# Patient Record
Sex: Female | Born: 1990 | Race: Black or African American | Hispanic: No | Marital: Single | Smoking: Current some day smoker
Health system: Southern US, Community
[De-identification: ages and names within clinical notes are randomized; demographics above are authoritative.]

## PROBLEM LIST (undated history)

## (undated) ENCOUNTER — Inpatient Hospital Stay (HOSPITAL_COMMUNITY): Payer: Self-pay

## (undated) DIAGNOSIS — D649 Anemia, unspecified: Secondary | ICD-10-CM

---

## 2015-11-16 ENCOUNTER — Encounter (HOSPITAL_COMMUNITY): Payer: Self-pay | Admitting: *Deleted

## 2015-11-16 ENCOUNTER — Inpatient Hospital Stay (HOSPITAL_COMMUNITY)
Admission: AD | Admit: 2015-11-16 | Discharge: 2015-11-16 | Disposition: A | Discharge status: Home / Self Care | Payer: BLUE CROSS/BLUE SHIELD | Source: Ambulatory Visit | Attending: Obstetrics and Gynecology | Admitting: Obstetrics and Gynecology

## 2015-11-16 ENCOUNTER — Inpatient Hospital Stay (HOSPITAL_COMMUNITY): Payer: BLUE CROSS/BLUE SHIELD

## 2015-11-16 DIAGNOSIS — R102 Pelvic and perineal pain: Secondary | ICD-10-CM | POA: Diagnosis not present

## 2015-11-16 DIAGNOSIS — N83201 Unspecified ovarian cyst, right side: Secondary | ICD-10-CM | POA: Diagnosis not present

## 2015-11-16 DIAGNOSIS — O219 Vomiting of pregnancy, unspecified: Secondary | ICD-10-CM | POA: Diagnosis not present

## 2015-11-16 DIAGNOSIS — O3481 Maternal care for other abnormalities of pelvic organs, first trimester: Secondary | ICD-10-CM | POA: Diagnosis not present

## 2015-11-16 DIAGNOSIS — Z3491 Encounter for supervision of normal pregnancy, unspecified, first trimester: Secondary | ICD-10-CM

## 2015-11-16 DIAGNOSIS — O98811 Other maternal infectious and parasitic diseases complicating pregnancy, first trimester: Secondary | ICD-10-CM

## 2015-11-16 DIAGNOSIS — O23591 Infection of other part of genital tract in pregnancy, first trimester: Secondary | ICD-10-CM

## 2015-11-16 DIAGNOSIS — O26891 Other specified pregnancy related conditions, first trimester: Secondary | ICD-10-CM | POA: Diagnosis present

## 2015-11-16 DIAGNOSIS — B373 Candidiasis of vulva and vagina: Secondary | ICD-10-CM

## 2015-11-16 DIAGNOSIS — K117 Disturbances of salivary secretion: Secondary | ICD-10-CM

## 2015-11-16 DIAGNOSIS — O26899 Other specified pregnancy related conditions, unspecified trimester: Secondary | ICD-10-CM

## 2015-11-16 DIAGNOSIS — Z3A01 Less than 8 weeks gestation of pregnancy: Secondary | ICD-10-CM | POA: Insufficient documentation

## 2015-11-16 DIAGNOSIS — O209 Hemorrhage in early pregnancy, unspecified: Secondary | ICD-10-CM | POA: Diagnosis not present

## 2015-11-16 DIAGNOSIS — N760 Acute vaginitis: Secondary | ICD-10-CM

## 2015-11-16 DIAGNOSIS — B3731 Acute candidiasis of vulva and vagina: Secondary | ICD-10-CM

## 2015-11-16 DIAGNOSIS — B9689 Other specified bacterial agents as the cause of diseases classified elsewhere: Secondary | ICD-10-CM

## 2015-11-16 DIAGNOSIS — R109 Unspecified abdominal pain: Secondary | ICD-10-CM

## 2015-11-16 HISTORY — DX: Anemia, unspecified: D64.9

## 2015-11-16 LAB — COMPREHENSIVE METABOLIC PANEL
ALT: 15 U/L (ref 14–54)
ANION GAP: 9 (ref 5–15)
AST: 18 U/L (ref 15–41)
Albumin: 4.2 g/dL (ref 3.5–5.0)
Alkaline Phosphatase: 63 U/L (ref 38–126)
BUN: 6 mg/dL (ref 6–20)
CHLORIDE: 104 mmol/L (ref 101–111)
CO2: 21 mmol/L — AB (ref 22–32)
Calcium: 9.1 mg/dL (ref 8.9–10.3)
Creatinine, Ser: 0.55 mg/dL (ref 0.44–1.00)
GFR calc non Af Amer: >60 mL/min (ref >60)
Glucose, Bld: 93 mg/dL (ref 65–99)
POTASSIUM: 3.4 mmol/L — AB (ref 3.5–5.1)
SODIUM: 134 mmol/L — AB (ref 135–145)
Total Bilirubin: 0.6 mg/dL (ref 0.3–1.2)
Total Protein: 7.9 g/dL (ref 6.5–8.1)

## 2015-11-16 LAB — URINALYSIS, ROUTINE W REFLEX MICROSCOPIC
Bilirubin Urine: NEGATIVE
GLUCOSE, UA: NEGATIVE mg/dL
HGB URINE DIPSTICK: NEGATIVE
KETONES UR: 15 mg/dL — AB
LEUKOCYTES UA: NEGATIVE
Nitrite: NEGATIVE
PH: 6.5 (ref 5.0–8.0)
Protein, ur: NEGATIVE mg/dL
Specific Gravity, Urine: 1.02 (ref 1.005–1.030)

## 2015-11-16 LAB — WET PREP, GENITAL
SPERM: NONE SEEN
TRICH WET PREP: NONE SEEN

## 2015-11-16 LAB — ABO/RH: ABO/RH(D): O POS

## 2015-11-16 LAB — CBC
HCT: 37.7 % (ref 36.0–46.0)
HEMOGLOBIN: 12.4 g/dL (ref 12.0–15.0)
MCH: 25 pg — AB (ref 26.0–34.0)
MCHC: 32.9 g/dL (ref 30.0–36.0)
MCV: 76 fL — AB (ref 78.0–100.0)
Platelets: 311 10*3/uL (ref 150–400)
RBC: 4.96 MIL/uL (ref 3.87–5.11)
RDW: 19.5 % — ABNORMAL HIGH (ref 11.5–15.5)
WBC: 8.8 10*3/uL (ref 4.0–10.5)

## 2015-11-16 LAB — POCT PREGNANCY, URINE: Preg Test, Ur: POSITIVE — AB

## 2015-11-16 LAB — HCG, QUANTITATIVE, PREGNANCY: hCG, Beta Chain, Quant, S: 48488 m[IU]/mL — ABNORMAL HIGH (ref <5)

## 2015-11-16 MED ORDER — METRONIDAZOLE 500 MG PO TABS: 500.0000 mg | ORAL_TABLET | Freq: Two times a day (BID) | ORAL | 0 refills | Status: DC

## 2015-11-16 MED ORDER — METOCLOPRAMIDE HCL 10 MG PO TABS
10.0000 mg | ORAL_TABLET | Freq: Once | ORAL | Status: AC
  Administered 2015-11-16: 10 mg via ORAL
  Filled 2015-11-16: qty 1

## 2015-11-16 MED ORDER — GLYCOPYRROLATE 1 MG PO TABS
2.0000 mg | ORAL_TABLET | Freq: Once | ORAL | Status: AC
  Administered 2015-11-16: 2 mg via ORAL
  Filled 2015-11-16: qty 2

## 2015-11-16 MED ORDER — LACTATED RINGERS IV BOLUS (SEPSIS): 1000.0000 mL | Freq: Once | INTRAVENOUS | Status: DC

## 2015-11-16 MED ORDER — GLYCOPYRROLATE 0.2 MG/ML IJ SOLN
0.2000 mg | Freq: Once | INTRAMUSCULAR | Status: DC
  Filled 2015-11-16: qty 1

## 2015-11-16 MED ORDER — PROMETHAZINE HCL 25 MG PO TABS: 25.0000 mg | ORAL_TABLET | Freq: Four times a day (QID) | ORAL | 0 refills | Status: DC | PRN

## 2015-11-16 MED ORDER — GLYCOPYRROLATE 1 MG PO TABS: 1.0000 mg | ORAL_TABLET | Freq: Three times a day (TID) | ORAL | 0 refills | Status: DC

## 2015-11-16 MED ORDER — TERCONAZOLE 0.8 % VA CREA: 1.0000 | TOPICAL_CREAM | Freq: Every day | VAGINAL | 0 refills | Status: DC

## 2015-11-16 MED ORDER — METOCLOPRAMIDE HCL 5 MG/ML IJ SOLN: 10.0000 mg | Freq: Once | INTRAMUSCULAR | Status: DC

## 2015-11-16 NOTE — Discharge Instructions (Signed)
Abdominal Pain During Pregnancy °Abdominal pain is common in pregnancy. Most of the time, it does not cause harm. There are many causes of abdominal pain. Some causes are more serious than others. Some of the causes of abdominal pain in pregnancy are easily diagnosed. Occasionally, the diagnosis takes time to understand. Other times, the cause is not determined. Abdominal pain can be a sign that something is very wrong with the pregnancy, or the pain may have nothing to do with the pregnancy at all. For this reason, always tell your health care provider if you have any abdominal discomfort. °HOME CARE INSTRUCTIONS  °Monitor your abdominal pain for any changes. The following actions may help to alleviate any discomfort you are experiencing: °· Do not have sexual intercourse or put anything in your vagina until your symptoms go away completely. °· Get plenty of rest until your pain improves. °· Drink clear fluids if you feel nauseous. Avoid solid food as long as you are uncomfortable or nauseous. °· Only take over-the-counter or prescription medicine as directed by your health care provider. °· Keep all follow-up appointments with your health care provider. °SEEK IMMEDIATE MEDICAL CARE IF: °· You are bleeding, leaking fluid, or passing tissue from the vagina. °· You have increasing pain or cramping. °· You have persistent vomiting. °· You have painful or bloody urination. °· You have a fever. °· You notice a decrease in your baby's movements. °· You have extreme weakness or feel faint. °· You have shortness of breath, with or without abdominal pain. °· You develop a severe headache with abdominal pain. °· You have abnormal vaginal discharge with abdominal pain. °· You have persistent diarrhea. °· You have abdominal pain that continues even after rest, or gets worse. °MAKE SURE YOU:  °· Understand these instructions. °· Will watch your condition. °· Will get help right away if you are not doing well or get worse. °    °This information is not intended to replace advice given to you by your health care provider. Make sure you discuss any questions you have with your health care provider. °  °Document Released: 01/09/2005 Document Revised: 10/30/2012 Document Reviewed: 08/08/2012 °Elsevier Interactive Patient Education ©2016 Elsevier Inc[employer] °Morning Sickness °Morning sickness is when you feel sick to your stomach (nauseous) during pregnancy. This nauseous feeling may or may not come with vomiting. It often occurs in the morning but can be a problem any time of day. Morning sickness is most common during the first trimester, but it may continue throughout pregnancy. While morning sickness is unpleasant, it is usually harmless unless you develop severe and continual vomiting (hyperemesis gravidarum). This condition requires more intense treatment.  °CAUSES  °The cause of morning sickness is not completely known but seems to be related to normal hormonal changes that occur in pregnancy. °RISK FACTORS °You are at greater risk if you: °· Experienced nausea or vomiting before your pregnancy. °· Had morning sickness during a previous pregnancy. °· Are pregnant with more than one baby, such as twins. °TREATMENT  °Do not use any medicines (prescription, over-the-counter, or herbal) for morning sickness without first talking to your health care provider. Your health care provider may prescribe or recommend: °· Vitamin B6 supplements. °· Anti-nausea medicines. °· The herbal medicine ginger. °HOME CARE INSTRUCTIONS  °· Only take over-the-counter or prescription medicines as directed by your health care provider. °· Taking multivitamins before getting pregnant can prevent or decrease the severity of morning sickness in most women. °· Eat a piece   of dry toast or unsalted crackers before getting out of bed in the morning. °· Eat five or six small meals a day. °· Eat dry and bland foods (rice, baked potato). Foods high in carbohydrates are often  helpful. °· Do not drink liquids with your meals. Drink liquids between meals. °· Avoid greasy, fatty, and spicy foods. °· Get someone to cook for you if the smell of any food causes nausea and vomiting. °· If you feel nauseous after taking prenatal vitamins, take the vitamins at night or with a snack.  °· Snack on protein foods (nuts, yogurt, cheese) between meals if you are hungry. °· Eat unsweetened gelatins for desserts. °· Wearing an acupressure wristband (worn for sea sickness) may be helpful. °· Acupuncture may be helpful. °· Do not smoke. °· Get a humidifier to keep the air in your house free of odors. °· Get plenty of fresh air. °SEEK MEDICAL CARE IF:  °· Your home remedies are not working, and you need medicine. °· You feel dizzy or lightheaded. °· You are losing weight. °SEEK IMMEDIATE MEDICAL CARE IF:  °· You have persistent and uncontrolled nausea and vomiting. °· You pass out (faint). °MAKE SURE YOU: °· Understand these instructions. °· Will watch your condition. °· Will get help right away if you are not doing well or get worse. °  °This information is not intended to replace advice given to you by your health care provider. Make sure you discuss any questions you have with your health care provider. °  °Document Released: 03/02/2006 Document Revised: 01/14/2013 Document Reviewed: 06/26/2012 °Elsevier Interactive Patient Education ©2016 Elsevier Inc. ° °

## 2015-11-16 NOTE — MAU Note (Signed)
Pt had pos HPT last week, vomiting for the last 3 weeks, denies diarrhea.  Also having lower abd pain also for 3 weeks, had spotting yesterday, none today.

## 2015-11-16 NOTE — MAU Provider Note (Signed)
History     CSN: [id number]  Arrival date and time: 11/16/15 1309   First Provider Initiated Contact with Patient 11/16/15 1513      Chief Complaint  Patient presents with  . Abdominal Pain  . Vaginal Bleeding  . Emesis During Pregnancy   HPI  Elizabeth Strong is a 25 y.o. G2P0010 at 68w5dby LMP who presents with n/v & abdominal cramping. Reports n/v since finding out she was pregnant. States she hasn't been able to keep down food or fluids for that last 5 days. Has not tried to eat anything today; attempted to drink ginger ale this morning but vomited. Reports vomiting 3 times today. Denies heartburn. Pt primarily spitting during interview.  Lower abdominal cramping for the last few weeks that has gradually worsened. Pain is intermittent. Rates pain 9/10. Has not treated. Nothing makes better or worse. Had 1 episode of pink spotting last night. Denies recent intercourse.  Denies diarrhea, constipation, fever, vaginal discharge, or dysuria.   OB History    Gravida Para Term Preterm AB Living   2       1     SAB TAB Ectopic Multiple Live Births   1              Past Medical History:  Diagnosis Date  . Anemia     History reviewed. No pertinent surgical history.  History reviewed. No pertinent family history.  Social History  Substance Use Topics  . Smoking status: Unknown If Ever Smoked  . Smokeless tobacco: Never Used  . Alcohol use Yes     Comment: drinks socially    Allergies: No Known Allergies  Prescriptions Prior to Admission  Medication Sig Dispense Refill Last Dose  . ibuprofen (ADVIL,MOTRIN) 200 MG tablet Take 400 mg by mouth every 6 (six) hours as needed for headache, mild pain or moderate pain.   11/15/2015 at 2200    Review of Systems  Constitutional: Negative.   Gastrointestinal: Positive for abdominal pain, nausea and vomiting. Negative for constipation, diarrhea and heartburn.  Genitourinary: Negative for dysuria.       + episode of pink spotting  last night   Physical Exam   Blood pressure 104/55, pulse 64, temperature 98.7 F (37.1 C), temperature source Oral, resp. rate 18, height '5\' 3"'$  (1.6 m), weight 124 lb (56.2 kg), last menstrual period 10/07/2015.  Physical Exam  Nursing note and vitals reviewed. Constitutional: She is oriented to person, place, and time. She appears well-developed and well-nourished. No distress.  HENT:  Head: Normocephalic and atraumatic.  Eyes: Conjunctivae are normal. Right eye exhibits no discharge. Left eye exhibits no discharge. No scleral icterus.  Neck: Normal range of motion.  Cardiovascular: Normal rate, regular rhythm and normal heart sounds.   No murmur heard. Respiratory: Effort normal and breath sounds normal. No respiratory distress. She has no wheezes.  GI: Soft. Bowel sounds are normal. She exhibits no distension. There is no tenderness. There is no rebound and no guarding.  Genitourinary: Uterus normal. Cervix exhibits no motion tenderness and no friability. Right adnexum displays no mass and no tenderness. Left adnexum displays no mass and no tenderness. No bleeding in the vagina. Vaginal discharge (small amount of creamy white discharge) found.  Genitourinary Comments: Cervix closed  Neurological: She is alert and oriented to person, place, and time.  Skin: Skin is warm and dry. She is not diaphoretic.  Psychiatric: She has a normal mood and affect. Her behavior is normal. Judgment and thought content  normal.    MAU Course  Procedures Results for orders placed or performed during the hospital encounter of 11/16/15 (from the past 48 hour(s))  Urinalysis, Routine w reflex microscopic (not at [facility])     Status: Abnormal   Collection Time: 11/16/15  1:35 PM  Result Value Ref Range   Color, Urine YELLOW YELLOW   APPearance CLOUDY (A) CLEAR   Specific Gravity, Urine 1.020 1.005 - 1.030   pH 6.5 5.0 - 8.0   Glucose, UA NEGATIVE NEGATIVE mg/dL   Hgb urine dipstick NEGATIVE NEGATIVE    Bilirubin Urine NEGATIVE NEGATIVE   Ketones, ur 15 (A) NEGATIVE mg/dL   Protein, ur NEGATIVE NEGATIVE mg/dL   Nitrite NEGATIVE NEGATIVE   Leukocytes, UA NEGATIVE NEGATIVE    Comment: MICROSCOPIC NOT DONE ON URINES WITH NEGATIVE PROTEIN, BLOOD, LEUKOCYTES, NITRITE, OR GLUCOSE <1000 mg/dL.  Pregnancy, urine POC     Status: Abnormal   Collection Time: 11/16/15  1:40 PM  Result Value Ref Range   Preg Test, Ur POSITIVE (A) NEGATIVE    Comment:        THE SENSITIVITY OF THIS METHODOLOGY IS >24 mIU/mL   CBC     Status: Abnormal   Collection Time: 11/16/15  3:22 PM  Result Value Ref Range   WBC 8.8 4.0 - 10.5 K/uL   RBC 4.96 3.87 - 5.11 MIL/uL   Hemoglobin 12.4 12.0 - 15.0 g/dL   HCT 37.7 36.0 - 46.0 %   MCV 76.0 (L) 78.0 - 100.0 fL   MCH 25.0 (L) 26.0 - 34.0 pg   MCHC 32.9 30.0 - 36.0 g/dL   RDW 19.5 (H) 11.5 - 15.5 %   Platelets 311 150 - 400 K/uL  ABO/Rh     Status: None   Collection Time: 11/16/15  3:22 PM  Result Value Ref Range   ABO/RH(D) O POS   HIV antibody     Status: None   Collection Time: 11/16/15  3:22 PM  Result Value Ref Range   HIV Screen 4th Generation wRfx Non Reactive Non Reactive    Comment: (NOTE) Performed At: [facility] [address] [place], [place] [id number] Lindon Romp MD QR:[id number]   hCG, quantitative, pregnancy     Status: Abnormal   Collection Time: 11/16/15  3:24 PM  Result Value Ref Range   hCG, Beta Chain, Quant, S 48,488 (H) <5 mIU/mL    Comment:          GEST. AGE      CONC.  (mIU/mL)   <=1 WEEK        5 - 50     2 WEEKS       50 - 500     3 WEEKS       100 - 10,000     4 WEEKS     1,000 - 30,000     5 WEEKS     3,500 - 115,000   6-8 WEEKS     12,000 - 270,000    12 WEEKS     15,000 - 220,000        FEMALE AND NON-PREGNANT FEMALE:     LESS THAN 5 mIU/mL   Comprehensive metabolic panel     Status: Abnormal   Collection Time: 11/16/15  3:24 PM  Result Value Ref Range   Sodium 134 (L) 135 - 145 mmol/L   Potassium  3.4 (L) 3.5 - 5.1 mmol/L   Chloride 104 101 - 111 mmol/L   CO2 21 (  L) 22 - 32 mmol/L   Glucose, Bld 93 65 - 99 mg/dL   BUN 6 6 - 20 mg/dL   Creatinine, Ser 0.55 0.44 - 1.00 mg/dL   Calcium 9.1 8.9 - 10.3 mg/dL   Total Protein 7.9 6.5 - 8.1 g/dL   Albumin 4.2 3.5 - 5.0 g/dL   AST 18 15 - 41 U/L   ALT 15 14 - 54 U/L   Alkaline Phosphatase 63 38 - 126 U/L   Total Bilirubin 0.6 0.3 - 1.2 mg/dL   GFR calc non Af Amer >60 >60 mL/min   GFR calc Af Amer >60 >60 mL/min    Comment: (NOTE) The eGFR has been calculated using the CKD EPI equation. This calculation has not been validated in all clinical situations. eGFR's persistently <60 mL/min signify possible Chronic Kidney Disease.    Anion gap 9 5 - 15  Wet prep, genital     Status: Abnormal   Collection Time: 11/16/15  3:30 PM  Result Value Ref Range   Yeast Wet Prep HPF POC PRESENT (A) NONE SEEN   Trich, Wet Prep NONE SEEN NONE SEEN   Clue Cells Wet Prep HPF POC PRESENT (A) NONE SEEN   WBC, Wet Prep HPF POC MANY (A) NONE SEEN   Sperm NONE SEEN    US Ob Comp Less 14 Wks  Result Date: 11/16/2015 CLINICAL DATA:  25 year old pregnant female presenting with 3 weeks of intermittent mid pelvic pain. Spotting 1 day prior. Quantitative beta HCG [id number]. EDC by LMP: 07/13/2016, projecting to an expected gestational age of [redacted] weeks 5 days. EXAM: OBSTETRIC <14 WK [place] AND TRANSVAGINAL OB US TECHNIQUE: Both transabdominal and transvaginal ultrasound examinations were performed for complete evaluation of the gestation as well as the maternal uterus, adnexal regions, and pelvic cul-de-sac. Transvaginal technique was performed to assess early pregnancy. COMPARISON:  None. FINDINGS: Intrauterine gestational sac: Single intrauterine gestational sac appears normal in size, shape and position. Yolk sac:  Present. Embryo:  Present. Embryonic Cardiac Activity: Present. Embryonic Heart Rate: 110  bpm CRL:  1.9  mm   6 w   1 d                  [place] EDC: 07/10/2016  Subchorionic hemorrhage:  None visualized. Maternal uterus/adnexae: Uterus is anteverted. There is a solitary 1.6 x 1.3 x 2.0 cm subserosal fibroid demonstrated in the left anterior uterine body. Right ovary measures 5.3 x 3.7 x 4.5 cm and contains a simple 3.2 x 2.1 x 3.2 cm cyst, likely representing a corpus luteal cyst. Left ovary measures 2.6 x 1.8 x 1.7 cm. No suspicious ovarian or adnexal masses. IMPRESSION: 1. Single living intrauterine gestation at 6 weeks 1 day by crown-rump length, with no significant discrepancy with the expected gestational age of [redacted] weeks 5 days by provided menstrual dating. 2. No perigestational bleed. 3. Embryonic heart rate 110 beats per minute, borderline low, probably normal given the early gestational age. Consider a follow-up obstetric scan in 4 weeks to document appropriate fetal growth. 4. Simple 3.2 cm right ovarian cyst, likely a corpus luteal cyst. No suspicious ovarian or adnexal masses. 5. Solitary subserosal 2.0 cm left anterior uterine body fibroid. Electronically Signed   By: Ilona Sorrel M.D.   On: 11/16/2015 18:25   US Ob Transvaginal  Result Date: 11/16/2015 CLINICAL DATA:  25 year old pregnant female presenting with 3 weeks of intermittent mid pelvic pain. Spotting 1 day prior. Quantitative beta HCG [id number]. EDC by LMP: 07/13/2016,  projecting to an expected gestational age of [redacted] weeks 5 days. EXAM: OBSTETRIC <14 WK [place] AND TRANSVAGINAL OB US TECHNIQUE: Both transabdominal and transvaginal ultrasound examinations were performed for complete evaluation of the gestation as well as the maternal uterus, adnexal regions, and pelvic cul-de-sac. Transvaginal technique was performed to assess early pregnancy. COMPARISON:  None. FINDINGS: Intrauterine gestational sac: Single intrauterine gestational sac appears normal in size, shape and position. Yolk sac:  Present. Embryo:  Present. Embryonic Cardiac Activity: Present. Embryonic Heart Rate: 110  bpm CRL:  1.9  mm   6 w   1  d                  [place] EDC: 07/10/2016 Subchorionic hemorrhage:  None visualized. Maternal uterus/adnexae: Uterus is anteverted. There is a solitary 1.6 x 1.3 x 2.0 cm subserosal fibroid demonstrated in the left anterior uterine body. Right ovary measures 5.3 x 3.7 x 4.5 cm and contains a simple 3.2 x 2.1 x 3.2 cm cyst, likely representing a corpus luteal cyst. Left ovary measures 2.6 x 1.8 x 1.7 cm. No suspicious ovarian or adnexal masses. IMPRESSION: 1. Single living intrauterine gestation at 6 weeks 1 day by crown-rump length, with no significant discrepancy with the expected gestational age of [redacted] weeks 5 days by provided menstrual dating. 2. No perigestational bleed. 3. Embryonic heart rate 110 beats per minute, borderline low, probably normal given the early gestational age. Consider a follow-up obstetric scan in 4 weeks to document appropriate fetal growth. 4. Simple 3.2 cm right ovarian cyst, likely a corpus luteal cyst. No suspicious ovarian or adnexal masses. 5. Solitary subserosal 2.0 cm left anterior uterine body fibroid. Electronically Signed   By: Ilona Sorrel M.D.   On: 11/16/2015 18:25     MDM +UPT UA, wet prep, GC/chlamydia, CBC, ABO/Rh, quant hCG, HIV, and [place] today to rule out ectopic pregnancy  IV LR bolus, reglan 10 mg IV, & robinul 0.2 mg IV ---- pt declined IV; requests PO PO reglan 10 mg & robinul 1 mg Ultrasound shows SIUP with cardiac activity Pt reports improvement in symptoms  Assessment and Plan  A:  1. Normal IUP (intrauterine pregnancy) on prenatal ultrasound, first trimester   2. Vaginal bleeding in pregnancy, first trimester   3. Abdominal pain during pregnancy   4. Nausea and vomiting during pregnancy prior to [redacted] weeks gestation   5. Ptyalism   6. Vaginal yeast infection   7. BV (bacterial vaginosis)    P: Discharge home Rx robinul, phenergan, flagyl, & terazol Discussed reasons to return to [facility] Start prenatal care with provider of choice  Jorje Guild 11/16/2015, 3:13 PM

## 2015-11-17 LAB — HIV ANTIBODY (ROUTINE TESTING W REFLEX): HIV SCREEN 4TH GENERATION: NONREACTIVE

## 2015-11-17 LAB — GC/CHLAMYDIA PROBE AMP (~~LOC~~) NOT AT ARMC
Chlamydia: NEGATIVE
NEISSERIA GONORRHEA: NEGATIVE

## 2016-02-09 ENCOUNTER — Ambulatory Visit: Payer: BLUE CROSS/BLUE SHIELD | Admitting: Internal Medicine

## 2016-02-19 NOTE — Progress Notes (Addendum)
Office Visit Note  Patient: Elizabeth Strong             Date of Birth: 1990-01-24           MRN: [id number]             PCP: Kennyth Arnold, FNP Referring: Kennyth Arnold, FNP Visit Date: 02/22/2016 Occupation: [employer]    Subjective:  Rash   History of Present Illness: Elizabeth Strong is a 26 y.o. female who [facility] her rash and positive ANA. According to patient she has had eczema since she was a young child area and she recalls the rash she is to be pleuritic and used to bleed. She recalls in 2015 she started developing rash during the summertime which will be only 1 or 2 spots in different parts of her body which will come and go. September 2017 she moved into a mold infested apartment. She states one day she woke up with rash all over her body which was not pruritic but she had dark spots. She states that rash has been persistent since then and she has new lesions coming up most the time. She's been [facility] where she had blood work and also was given topical steroids which are not effective. She denies any joint pain.  Activities of Daily Living:  Patient reports morning stiffness for 0 minute.   Patient Denies nocturnal pain.  Difficulty dressing/grooming: Denies Difficulty climbing stairs: Denies Difficulty getting out of chair: Denies Difficulty using hands for taps, buttons, cutlery, and/or writing: Denies   Review of Systems  Constitutional: Negative for fatigue, night sweats, weight gain, weight loss and weakness.  HENT: Negative for mouth sores, trouble swallowing, trouble swallowing, mouth dryness and nose dryness.   Eyes: Negative for pain, redness, visual disturbance and dryness.  Respiratory: Negative for cough, shortness of breath and difficulty breathing.   Cardiovascular: Negative for chest pain, palpitations, hypertension, irregular heartbeat and swelling in legs/feet.  Gastrointestinal: Negative  for blood in stool, constipation and diarrhea.  Endocrine: Negative for increased urination.  Genitourinary: Negative for vaginal dryness.  Musculoskeletal: Negative for arthralgias, joint pain, joint swelling, myalgias, muscle weakness, morning stiffness, muscle tenderness and myalgias.  Skin: Positive for rash. Negative for color change, hair loss, skin tightness, ulcers and sensitivity to sunlight.  Allergic/Immunologic: Negative for susceptible to infections.  Neurological: Negative for dizziness, memory loss and night sweats.  Hematological: Negative for swollen glands.  Psychiatric/Behavioral: Positive for depressed mood and sleep disturbance. The patient is nervous/anxious.     PMFS History:  There are no active problems to display for this patient.   Past Medical History:  Diagnosis Date  . Anemia     Family History  Problem Relation Age of Onset  . Eczema Father    History reviewed. No pertinent surgical history. Social History   Social History Narrative  . No narrative on file     Objective: Vital Signs: BP 107/71 (BP Location: Left Arm, Patient Position: Sitting, Cuff Size: Normal)   Pulse 79   Resp 12   Ht 5' 3"  (1.6 m)   Wt 123 lb (55.8 kg)   LMP 10/07/2015   Breastfeeding? Unknown   BMI 21.79 kg/m    Physical Exam  Constitutional: She is oriented to person, place, and time. She appears well-developed and well-nourished.  HENT:  Head: Normocephalic and atraumatic.  Eyes: Conjunctivae and EOM are normal.  Neck: Normal range of motion.  Cardiovascular: Normal rate, regular rhythm, normal heart sounds and intact distal pulses.   Pulmonary/Chest: Effort normal and breath sounds normal.  Abdominal: Soft. Bowel sounds are normal.  Lymphadenopathy:    She has no cervical adenopathy.  Neurological: She is alert and oriented to person, place, and time.  Skin: Skin is warm and dry. Capillary refill takes less than 2 seconds.  She has multiple hyperpigmented  lesions on her trunk. Mostly localized on her buttocks and inguinal region. Few lesions on her back and chest. And few lesions on her feet. The lesions on her feet may be new.  Psychiatric: She has a normal mood and affect. Her behavior is normal.  Nursing note and vitals reviewed.    Musculoskeletal Exam: C-spine, thoracic, lumbar spine good range of motion she has good range of motion of her shoulders elbows wrists joint MCPs PIPs DIPs PIPs with no synovitis hip joints knee joints ankles MTPs PIPs DIPs with good range of motion with no synovitis.  CDAI Exam: No CDAI exam completed.    Investigation: Findings:  12/14/2015 ANA 1:40 speckled, SSA, SSB, SCL 70, Jo 1, RNP, centromere, anti-DNA, Smith, chromatin all negative, RPR negative    Imaging: No results found.  Speciality Comments: No specialty comments available.    Procedures:  No procedures performed Allergies: Patient has no known allergies.   Assessment / Plan:     Visit Diagnoses: Positive ANA (antinuclear antibody) - 1:40 speckled, ENA negative -her ANA titer is insignificant and ENA is negative. She has no sleep or rash with some hyperpigmentation but she has few new lesions on her feet. She has persistent rash going on since September now. I will refer her to dermatology for possible skin biopsy. At this point I'm uncertain to determine the etiology. Plan: Ambulatory referral to Dermatology. At this point I would avoid any steroids until diagnosis is established.  Rash - Plan: Ambulatory referral to Dermatology, CBC with Differential/Platelet, COMPLETE METABOLIC PANEL WITH GFR, Urinalysis, Routine w reflex microscopic, Sedimentation rate, CK, HLA-B27 antigen, Glucose 6 phosphate dehydrogenase, Pan-ANCA  History of asthma - Childhood    Orders: Orders Placed This Encounter  Procedures  . CBC with Differential/Platelet  . COMPLETE METABOLIC PANEL WITH GFR  . Urinalysis, Routine w reflex microscopic  .  Sedimentation rate  . CK  . HLA-B27 antigen  . Glucose 6 phosphate dehydrogenase  . Pan-ANCA  . Ambulatory referral to Dermatology   No orders of the defined types were placed in this encounter.   Face-to-face time spent with patient was 50 minutes. 50% of time was spent in counseling and coordination of care.  Follow-Up Instructions: Return for Rash. In about 8 weeks as it may take time to get the skin biopsy results.   Bo Merino, MD  Note - This record has been created using Editor, commissioning.  Chart creation errors have been sought, but may not always  have been located. Such creation errors do not reflect on  the standard of medical care.

## 2016-02-22 ENCOUNTER — Telehealth: Payer: Self-pay | Admitting: Radiology

## 2016-02-22 ENCOUNTER — Encounter: Payer: Self-pay | Admitting: Rheumatology

## 2016-02-22 ENCOUNTER — Ambulatory Visit (INDEPENDENT_AMBULATORY_CARE_PROVIDER_SITE_OTHER): Payer: BLUE CROSS/BLUE SHIELD | Admitting: Rheumatology

## 2016-02-22 VITALS — BP 107/71 | HR 79 | Resp 12 | Ht 63.0 in | Wt 123.0 lb

## 2016-02-22 DIAGNOSIS — R768 Other specified abnormal immunological findings in serum: Secondary | ICD-10-CM | POA: Diagnosis not present

## 2016-02-22 DIAGNOSIS — Z8709 Personal history of other diseases of the respiratory system: Secondary | ICD-10-CM | POA: Diagnosis not present

## 2016-02-22 DIAGNOSIS — R21 Rash and other nonspecific skin eruption: Secondary | ICD-10-CM | POA: Diagnosis not present

## 2016-02-22 LAB — CBC WITH DIFFERENTIAL/PLATELET
BASOS PCT: 1 %
Basophils Absolute: 62 cells/uL (ref 0–200)
Eosinophils Absolute: 62 cells/uL (ref 15–500)
Eosinophils Relative: 1 %
HEMATOCRIT: 43.7 % (ref 35.0–45.0)
HEMOGLOBIN: 13.6 g/dL (ref 11.7–15.5)
LYMPHS ABS: 1736 {cells}/uL (ref 850–3900)
Lymphocytes Relative: 28 %
MCH: 24.5 pg — ABNORMAL LOW (ref 27.0–33.0)
MCHC: 31.1 g/dL — ABNORMAL LOW (ref 32.0–36.0)
MCV: 78.7 fL — ABNORMAL LOW (ref 80.0–100.0)
MONO ABS: 620 {cells}/uL (ref 200–950)
MPV: 9.7 fL (ref 7.5–12.5)
Monocytes Relative: 10 %
NEUTROS ABS: 3720 {cells}/uL (ref 1500–7800)
Neutrophils Relative %: 60 %
Platelets: 317 10*3/uL (ref 140–400)
RBC: 5.55 MIL/uL — ABNORMAL HIGH (ref 3.80–5.10)
RDW: 17.4 % — ABNORMAL HIGH (ref 11.0–15.0)
WBC: 6.2 10*3/uL (ref 3.8–10.8)

## 2016-02-22 LAB — COMPLETE METABOLIC PANEL WITH GFR
ALBUMIN: 4.4 g/dL (ref 3.6–5.1)
ALK PHOS: 86 U/L (ref 33–115)
ALT: 20 U/L (ref 6–29)
AST: 24 U/L (ref 10–30)
BUN: 11 mg/dL (ref 7–25)
CALCIUM: 9.9 mg/dL (ref 8.6–10.2)
CO2: 21 mmol/L (ref 20–31)
CREATININE: 0.82 mg/dL (ref 0.50–1.10)
Chloride: 104 mmol/L (ref 98–110)
GFR, Est African American: >89 mL/min (ref >=60)
GFR, Est Non African American: >89 mL/min (ref >=60)
GLUCOSE: 84 mg/dL (ref 65–99)
Potassium: 4.6 mmol/L (ref 3.5–5.3)
SODIUM: 140 mmol/L (ref 135–146)
TOTAL PROTEIN: 8.3 g/dL — AB (ref 6.1–8.1)
Total Bilirubin: 0.5 mg/dL (ref 0.2–1.2)

## 2016-02-22 NOTE — Telephone Encounter (Signed)
I have called Dermatology and have made patient an appointment for tomorrow with Dr Doreen BeamWhitworth. [facility] You can complete the referral, see me if questions.

## 2016-02-22 NOTE — Patient Instructions (Signed)
  You can go tomorrow for the dermatology appointment with Dr Doreen BeamWhitworth. The appointment will be on 02/23/15 at 10:20am. Dr Corliss Skainseveshwar has asked for them to see you and do a biopsy of your rash, please take this paperwork with you when you go   Address: [address], [place], [place] [address]  Phone: [phone]

## 2016-02-23 LAB — URINALYSIS, ROUTINE W REFLEX MICROSCOPIC
Bilirubin Urine: NEGATIVE
GLUCOSE, UA: NEGATIVE
Hgb urine dipstick: NEGATIVE
Leukocytes, UA: NEGATIVE
Nitrite: NEGATIVE
PROTEIN: NEGATIVE
Specific Gravity, Urine: 1.026 (ref 1.001–1.035)
pH: 6 (ref 5.0–8.0)

## 2016-02-23 LAB — URINALYSIS, MICROSCOPIC ONLY
Casts: NONE SEEN [LPF]
Crystals: NONE SEEN [HPF]
YEAST: NONE SEEN [HPF]

## 2016-02-23 LAB — PAN-ANCA: ANCA Screen: NEGATIVE

## 2016-02-23 LAB — CK: Total CK: 89 U/L (ref 7–177)

## 2016-02-23 LAB — SEDIMENTATION RATE: SED RATE: 1 mm/h (ref 0–20)

## 2016-02-23 LAB — GLUCOSE 6 PHOSPHATE DEHYDROGENASE: G-6PDH: 15.1 U/g{Hb} (ref 7.0–20.5)

## 2016-02-29 LAB — HLA-B27 ANTIGEN: DNA Result:: NEGATIVE

## 2016-03-14 DIAGNOSIS — Z8709 Personal history of other diseases of the respiratory system: Secondary | ICD-10-CM | POA: Insufficient documentation

## 2016-03-14 DIAGNOSIS — R768 Other specified abnormal immunological findings in serum: Secondary | ICD-10-CM | POA: Insufficient documentation

## 2016-03-14 NOTE — Progress Notes (Deleted)
Office Visit Note  Patient: Elizabeth Strong             Date of Birth: 1990/03/22           MRN: [id number]             PCP: Kennyth Arnold, FNP Referring: No ref. provider found Visit Date: 03/24/2016 Occupation: _0 @    Subjective:  No chief complaint on file.   History of Present Illness: Elizabeth Strong is a 26 y.o. female ***   Activities of Daily Living:  Patient reports morning stiffness for *** {minute/hour:19697}.   Patient {ACTIONS;DENIES/REPORTS:21021675::"Denies"} nocturnal pain.  Difficulty dressing/grooming: {ACTIONS;DENIES/REPORTS:21021675::"Denies"} Difficulty climbing stairs: {ACTIONS;DENIES/REPORTS:21021675::"Denies"} Difficulty getting out of chair: {ACTIONS;DENIES/REPORTS:21021675::"Denies"} Difficulty using hands for taps, buttons, cutlery, and/or writing: {ACTIONS;DENIES/REPORTS:21021675::"Denies"}   No Rheumatology ROS completed.   PMFS History:  Patient Active Problem List   Diagnosis Date Noted  . ANA positive 03/14/2016  . History of asthma 03/14/2016    Past Medical History:  Diagnosis Date  . Anemia     Family History  Problem Relation Age of Onset  . Eczema Father    No past surgical history on file. Social History   Social History Narrative  . No narrative on file     Objective: Vital Signs: LMP 10/07/2015    Physical Exam   Musculoskeletal Exam: ***  CDAI Exam: No CDAI exam completed.    Investigation: Findings:  12/14/2015 ANA 1:40 speckled, SSA, SSB, SCL 70, Jo 1, RNP, centromere, anti-DNA, Smith, chromatin all negative, RPR negative 02/22/2016 CBC normal, CMP normal, ESR 1, CK 89, UA positive bacteria negative WBC, pANCA negative, HLA-B27 negative, G6PD normal  Office Visit on 02/22/2016  Component Date Value Ref Range Status  . WBC 02/22/2016 6.2  3.8 - 10.8 K/uL Final  . RBC 02/22/2016 5.55* 3.80 - 5.10 MIL/uL Final  . Hemoglobin 02/22/2016 13.6  11.7 - 15.5 g/dL Final  . HCT 02/22/2016 43.7  35.0 - 45.0  % Final  . MCV 02/22/2016 78.7* 80.0 - 100.0 fL Final  . MCH 02/22/2016 24.5* 27.0 - 33.0 pg Final  . MCHC 02/22/2016 31.1* 32.0 - 36.0 g/dL Final  . RDW 02/22/2016 17.4* 11.0 - 15.0 % Final  . Platelets 02/22/2016 317  140 - 400 K/uL Final  . MPV 02/22/2016 9.7  7.5 - 12.5 fL Final  . Neutro Abs 02/22/2016 3720  1,500 - 7,800 cells/uL Final  . Lymphs Abs 02/22/2016 1736  850 - 3,900 cells/uL Final  . Monocytes Absolute 02/22/2016 620  200 - 950 cells/uL Final  . Eosinophils Absolute 02/22/2016 62  15 - 500 cells/uL Final  . Basophils Absolute 02/22/2016 62  0 - 200 cells/uL Final  . Neutrophils Relative % 02/22/2016 60  % Final  . Lymphocytes Relative 02/22/2016 28  % Final  . Monocytes Relative 02/22/2016 10  % Final  . Eosinophils Relative 02/22/2016 1  % Final  . Basophils Relative 02/22/2016 1  % Final  . Smear Review 02/22/2016 Criteria for review not met   Final  . Sodium 02/22/2016 140  135 - 146 mmol/L Final  . Potassium 02/22/2016 4.6  3.5 - 5.3 mmol/L Final  . Chloride 02/22/2016 104  98 - 110 mmol/L Final  . CO2 02/22/2016 21  20 - 31 mmol/L Final  . Glucose, Bld 02/22/2016 84  65 - 99 mg/dL Final  . BUN 02/22/2016 11  7 - 25 mg/dL Final  . Creat 02/22/2016 0.82  0.50 - 1.10 mg/dL Final  .  Total Bilirubin 02/22/2016 0.5  0.2 - 1.2 mg/dL Final  . Alkaline Phosphatase 02/22/2016 86  33 - 115 U/L Final  . AST 02/22/2016 24  10 - 30 U/L Final  . ALT 02/22/2016 20  6 - 29 U/L Final  . Total Protein 02/22/2016 8.3* 6.1 - 8.1 g/dL Final  . Albumin 02/22/2016 4.4  3.6 - 5.1 g/dL Final  . Calcium 02/22/2016 9.9  8.6 - 10.2 mg/dL Final  . GFR, Est African American 02/22/2016 >89  >=60 mL/min Final  . GFR, Est Non African American 02/22/2016 >89  >=60 mL/min Final  . Color, Urine 02/22/2016 YELLOW  YELLOW Final  . APPearance 02/22/2016 TURBID* CLEAR Final  . Specific Gravity, Urine 02/22/2016 1.026  1.001 - 1.035 Final  . pH 02/22/2016 6.0  5.0 - 8.0 Final  . Glucose, UA  02/22/2016 NEGATIVE  NEGATIVE Final  . Bilirubin Urine 02/22/2016 NEGATIVE  NEGATIVE Final  . Ketones, ur 02/22/2016 TRACE* NEGATIVE Final  . Hgb urine dipstick 02/22/2016 NEGATIVE  NEGATIVE Final  . Protein, ur 02/22/2016 NEGATIVE  NEGATIVE Final  . Nitrite 02/22/2016 NEGATIVE  NEGATIVE Final  . Leukocytes, UA 02/22/2016 NEGATIVE  NEGATIVE Final  . Sed Rate 02/22/2016 1  0 - 20 mm/hr Final  . Total CK 02/22/2016 89  7 - 177 U/L Final  . DNA Result: 02/22/2016 Negative  Negative Final  . Results reviewed by: 02/22/2016 REPORT   Final   Comment: Ileene Hutchinson, Ph.D.,[facility] Senior Director, Molecular Genetics The B27 allele group of the HLA-B locus is present in 2 to 9% of the general population.  About 20% of HLA-B27 carriers develop autoimmune disorders including Ankylosing Spondylitis (AS), Reactive Arthritis, Psoriatic Arthritis, Undifferentiated Oligoarthritis, Uveitis, or Inflammatory Bowel Disease. The highest association is with AS, where approximately 95% of AS patients are HLA-B27 positive. Genetic counseling as needed. Typing performed by PCR and hybridization with sequence specific oligonucleotide probes (SSO) using the FDA-cleared LABType(R) SSO Kit.   Marland Kitchen G-6PDH 02/22/2016 15.1  7.0 - 20.5 U/g Hgb Final  . ANCA Screen 02/22/2016 Negative   Final   Comment:             ** Normal Reference Range: Negative **   ANCA Screen includes evaluation for p-ANCA, c-ANCA and Atypical p-ANCA.   Marland Kitchen Myeloperoxidase Abs 02/22/2016 <1.0  <1.0 AI Final   Comment:                                 Value   Interpretation                              <1.0 AI: No Antibody Detected                           >or=1.0 AI: Antibody Detected   Autoantibodies to myeloperoxidase (MPO) are commonly associated with the following small-vessel vasculitides: microscopic polyangiitis, polyarteritis nodosa, Churg-Strauss syndrome, necrotizing and crescentic glomerulonephritis and occasionally  Wegener's granulomatosis. The perinuclear IFA pattern, (p-ANCA), is based largely on autoantibody to myeloperoxidase which serves as the primary antigen   These autoantibodies are present in active disease state.     . Serine Protease 3 02/22/2016 <1.0  <1.0 AI Final   Comment:  Value   Interpretation                              <1.0 AI: No Antibody Detected                           >or=1.0 AI: Antibody Detected   Autoantibodies to proteinase-3 (PR-3) are accepted as characteristic for granulomatosis with polyangiitis (Wegener's), and are detectable in 95% of the histologically proven cases. The cytoplasmic IFA pattern, (c-ANCA), is based largely on autoantibody to PR-3 which serves as the primary antigen.   These autoantibodies are present in active disease state.     . WBC, UA 02/22/2016 0-5  <=5 WBC/HPF Final  . RBC / HPF 02/22/2016 0-2  <=2 RBC/HPF Final  . Squamous Epithelial / LPF 02/22/2016 0-5  <=5 HPF Final  . Bacteria, UA 02/22/2016 MANY* NONE SEEN HPF Final  . Crystals 02/22/2016 NONE SEEN  NONE SEEN HPF Final  . Casts 02/22/2016 NONE SEEN  NONE SEEN LPF Final  . Yeast 02/22/2016 NONE SEEN  NONE SEEN HPF Final  . Urine-Other 02/22/2016 SEE NOTE   Final     Imaging: No results found.  Speciality Comments: No specialty comments available.    Procedures:  No procedures performed Allergies: Patient has no known allergies.   Assessment / Plan:     Visit Diagnoses: ANA positive  Rash and other nonspecific skin eruption  History of asthma   Dermatologist evaluation and a skin biopsy? Orders: No orders of the defined types were placed in this encounter.  No orders of the defined types were placed in this encounter.   Face-to-face time spent with patient was *** minutes. 50% of time was spent in counseling and coordination of care.  Follow-Up Instructions: No Follow-up on file.   Amy Littrell, RT  Note - This  record has been created using [employer].  Chart creation errors have been sought, but may not always  have been located. Such creation errors do not reflect on  the standard of medical care.

## 2016-03-24 ENCOUNTER — Ambulatory Visit: Payer: Self-pay | Admitting: Rheumatology

## 2016-03-28 ENCOUNTER — Telehealth: Payer: Self-pay | Admitting: Rheumatology

## 2016-03-28 NOTE — Telephone Encounter (Signed)
Patient is scheduled for 04/18/16 @ 2:30.

## 2016-03-28 NOTE — Telephone Encounter (Signed)
-----   Message from Henriette CombsAndrea L Hatton, LPN sent at 1/6/10963/06/2016 11:40 AM EST ----- Please call patient and reschedule new patient follow up. Thanks!

## 2016-04-11 NOTE — Progress Notes (Deleted)
Office Visit Note  Patient: Elizabeth Strong             Date of Birth: [birth year]           MRN: [id number]             PCP: Kennyth Arnold, FNP Referring: Kennyth Arnold, FNP Visit Date: 04/18/2016 Occupation: @GUAROCC @    Subjective:  No chief complaint on file.   History of Present Illness: Elizabeth Strong is a 26 y.o. female ***   Activities of Daily Living:  Patient reports morning stiffness for *** {minute/hour:19697}.   Patient {ACTIONS;DENIES/REPORTS:21021675::"Denies"} nocturnal pain.  Difficulty dressing/grooming: {ACTIONS;DENIES/REPORTS:21021675::"Denies"} Difficulty climbing stairs: {ACTIONS;DENIES/REPORTS:21021675::"Denies"} Difficulty getting out of chair: {ACTIONS;DENIES/REPORTS:21021675::"Denies"} Difficulty using hands for taps, buttons, cutlery, and/or writing: {ACTIONS;DENIES/REPORTS:21021675::"Denies"}   No Rheumatology ROS completed.   PMFS History:  Patient Active Problem List   Diagnosis Date Noted  . ANA positive 03/14/2016  . History of asthma 03/14/2016    Past Medical History:  Diagnosis Date  . Anemia     Family History  Problem Relation Age of Onset  . Eczema Father    No past surgical history on file. Social History   Social History Narrative  . No narrative on file     Objective: Vital Signs: LMP 10/07/2015    Physical Exam   Musculoskeletal Exam: ***  CDAI Exam: No CDAI exam completed.    Investigation: Findings:  02/22/2016 CBC normal, CMP normal, UA negative, WBC negative, bacteria positive ESR 1 CK 89, G6PD normal, and ANCA negative, HLA-B27 negative  Office Visit on 02/22/2016  Component Date Value Ref Range Status  . WBC 02/22/2016 6.2  3.8 - 10.8 K/uL Final  . RBC 02/22/2016 5.55* 3.80 - 5.10 MIL/uL Final  . Hemoglobin 02/22/2016 13.6  11.7 - 15.5 g/dL Final  . HCT 02/22/2016 43.7  35.0 - 45.0 % Final  . MCV 02/22/2016 78.7* 80.0 - 100.0 fL Final  . MCH 02/22/2016 24.5* 27.0 - 33.0 pg Final  . MCHC  02/22/2016 31.1* 32.0 - 36.0 g/dL Final  . RDW 02/22/2016 17.4* 11.0 - 15.0 % Final  . Platelets 02/22/2016 317  140 - 400 K/uL Final  . MPV 02/22/2016 9.7  7.5 - 12.5 fL Final  . Neutro Abs 02/22/2016 3720  1,500 - 7,800 cells/uL Final  . Lymphs Abs 02/22/2016 1736  850 - 3,900 cells/uL Final  . Monocytes Absolute 02/22/2016 620  200 - 950 cells/uL Final  . Eosinophils Absolute 02/22/2016 62  15 - 500 cells/uL Final  . Basophils Absolute 02/22/2016 62  0 - 200 cells/uL Final  . Neutrophils Relative % 02/22/2016 60  % Final  . Lymphocytes Relative 02/22/2016 28  % Final  . Monocytes Relative 02/22/2016 10  % Final  . Eosinophils Relative 02/22/2016 1  % Final  . Basophils Relative 02/22/2016 1  % Final  . Smear Review 02/22/2016 Criteria for review not met   Final  . Sodium 02/22/2016 140  135 - 146 mmol/L Final  . Potassium 02/22/2016 4.6  3.5 - 5.3 mmol/L Final  . Chloride 02/22/2016 104  98 - 110 mmol/L Final  . CO2 02/22/2016 21  20 - 31 mmol/L Final  . Glucose, Bld 02/22/2016 84  65 - 99 mg/dL Final  . BUN 02/22/2016 11  7 - 25 mg/dL Final  . Creat 02/22/2016 0.82  0.50 - 1.10 mg/dL Final  . Total Bilirubin 02/22/2016 0.5  0.2 - 1.2 mg/dL Final  . Alkaline Phosphatase 02/22/2016 86  33 - 115 U/L Final  . AST 02/22/2016 24  10 - 30 U/L Final  . ALT 02/22/2016 20  6 - 29 U/L Final  . Total Protein 02/22/2016 8.3* 6.1 - 8.1 g/dL Final  . Albumin 02/22/2016 4.4  3.6 - 5.1 g/dL Final  . Calcium 02/22/2016 9.9  8.6 - 10.2 mg/dL Final  . GFR, Est African American 02/22/2016 >89  >=60 mL/min Final  . GFR, Est Non African American 02/22/2016 >89  >=60 mL/min Final  . Color, Urine 02/22/2016 YELLOW  YELLOW Final  . APPearance 02/22/2016 TURBID* CLEAR Final  . Specific Gravity, Urine 02/22/2016 1.026  1.001 - 1.035 Final  . pH 02/22/2016 6.0  5.0 - 8.0 Final  . Glucose, UA 02/22/2016 NEGATIVE  NEGATIVE Final  . Bilirubin Urine 02/22/2016 NEGATIVE  NEGATIVE Final  . Ketones, ur  02/22/2016 TRACE* NEGATIVE Final  . Hgb urine dipstick 02/22/2016 NEGATIVE  NEGATIVE Final  . Protein, ur 02/22/2016 NEGATIVE  NEGATIVE Final  . Nitrite 02/22/2016 NEGATIVE  NEGATIVE Final  . Leukocytes, UA 02/22/2016 NEGATIVE  NEGATIVE Final  . Sed Rate 02/22/2016 1  0 - 20 mm/hr Final  . Total CK 02/22/2016 89  7 - 177 U/L Final  . DNA Result: 02/22/2016 Negative  Negative Final  . Results reviewed by: 02/22/2016 REPORT   Final   Comment: Ileene Hutchinson, Ph.D.,[facility] Senior Director, Molecular Genetics The B27 allele group of the HLA-B locus is present in 2 to 9% of the general population.  About 20% of HLA-B27 carriers develop autoimmune disorders including Ankylosing Spondylitis (AS), Reactive Arthritis, Psoriatic Arthritis, Undifferentiated Oligoarthritis, Uveitis, or Inflammatory Bowel Disease. The highest association is with AS, where approximately 95% of AS patients are HLA-B27 positive. Genetic counseling as needed. Typing performed by PCR and hybridization with sequence specific oligonucleotide probes (SSO) using the FDA-cleared LABType(R) SSO Kit.   Marland Kitchen G-6PDH 02/22/2016 15.1  7.0 - 20.5 U/g Hgb Final  . ANCA Screen 02/22/2016 Negative   Final   Comment:             ** Normal Reference Range: Negative **   ANCA Screen includes evaluation for p-ANCA, c-ANCA and Atypical p-ANCA.   Marland Kitchen Myeloperoxidase Abs 02/22/2016 <1.0  <1.0 AI Final   Comment:                                 Value   Interpretation                              <1.0 AI: No Antibody Detected                           >or=1.0 AI: Antibody Detected   Autoantibodies to myeloperoxidase (MPO) are commonly associated with the following small-vessel vasculitides: microscopic polyangiitis, polyarteritis nodosa, Churg-Strauss syndrome, necrotizing and crescentic glomerulonephritis and occasionally Wegener's granulomatosis. The perinuclear IFA pattern, (p-ANCA), is based largely on autoantibody to  myeloperoxidase which serves as the primary antigen   These autoantibodies are present in active disease state.     . Serine Protease 3 02/22/2016 <1.0  <1.0 AI Final   Comment:                                 Value   Interpretation                              <  1.0 AI: No Antibody Detected                           >or=1.0 AI: Antibody Detected   Autoantibodies to proteinase-3 (PR-3) are accepted as characteristic for granulomatosis with polyangiitis (Wegener's), and are detectable in 95% of the histologically proven cases. The cytoplasmic IFA pattern, (c-ANCA), is based largely on autoantibody to PR-3 which serves as the primary antigen.   These autoantibodies are present in active disease state.     . WBC, UA 02/22/2016 0-5  <=5 WBC/HPF Final  . RBC / HPF 02/22/2016 0-2  <=2 RBC/HPF Final  . Squamous Epithelial / LPF 02/22/2016 0-5  <=5 HPF Final  . Bacteria, UA 02/22/2016 MANY* NONE SEEN HPF Final  . Crystals 02/22/2016 NONE SEEN  NONE SEEN HPF Final  . Casts 02/22/2016 NONE SEEN  NONE SEEN LPF Final  . Yeast 02/22/2016 NONE SEEN  NONE SEEN HPF Final  . Urine-Other 02/22/2016 SEE NOTE   Final     Imaging: No results found.  Speciality Comments: No specialty comments available.    Procedures:  No procedures performed Allergies: Patient has no known allergies.   Assessment / Plan:     Visit Diagnoses: ANA positive - 1:40 speckled, ENA negative  Rash - Hyperpigmented lesions on trunk, buttocks, back, chest and feet  History of asthma   Skin biopsy? Dermatology visit? Orders: No orders of the defined types were placed in this encounter.  No orders of the defined types were placed in this encounter.   Face-to-face time spent with patient was *** minutes. 50% of time was spent in counseling and coordination of care.  Follow-Up Instructions: No Follow-up on file.   Bo Merino, MD  Note - This record has been created using [employer].  Chart  creation errors have been sought, but may not always  have been located. Such creation errors do not reflect on  the standard of medical care.

## 2016-04-18 ENCOUNTER — Ambulatory Visit: Payer: Self-pay | Admitting: Rheumatology

## 2016-04-19 NOTE — Progress Notes (Signed)
Office Visit Note  Patient: Elizabeth Strong             Date of Birth: 1990/05/14           MRN: [id number]             PCP: Kennyth Arnold, FNP Referring: Kennyth Arnold, FNP Visit Date: 04/27/2016 Occupation: _0 @    Subjective:  Rash.   History of Present Illness: Elizabeth Strong is a 26 y.o. female who was seen recently for low titer positive ANA and skin rash. According to her she has had few lesions on her feet which appeared new to her. The rest of the lesions are gradually healing. Patient states that she has lower back pain for the last 1 year. In February she was having some numbness in her right lower extremity which is improved now. She has some residual numbness in her toes.  Activities of Daily Living:  Patient reports morning stiffness for 5 minutes.   Patient Denies nocturnal pain.  Difficulty dressing/grooming: Denies Difficulty climbing stairs: Denies Difficulty getting out of chair: Denies Difficulty using hands for taps, buttons, cutlery, and/or writing: Denies   Review of Systems  Constitutional: Negative for fatigue, night sweats, weight gain, weight loss and weakness.  HENT: Negative for mouth sores, trouble swallowing, trouble swallowing, mouth dryness and nose dryness.   Eyes: Negative for pain, redness, visual disturbance and dryness.  Respiratory: Negative for cough, shortness of breath and difficulty breathing.   Cardiovascular: Negative for chest pain, palpitations, hypertension, irregular heartbeat and swelling in legs/feet.  Gastrointestinal: Negative for blood in stool, constipation and diarrhea.  Endocrine: Negative for increased urination.  Genitourinary: Negative for vaginal dryness.  Musculoskeletal: Positive for arthralgias and joint pain. Negative for joint swelling, myalgias, muscle weakness, morning stiffness, muscle tenderness and myalgias.       Lower back pain  Skin: Positive for rash. Negative for color change, hair loss, skin  tightness, ulcers and sensitivity to sunlight.  Allergic/Immunologic: Negative for susceptible to infections.  Neurological: Negative for dizziness, memory loss and night sweats.  Hematological: Negative for swollen glands.  Psychiatric/Behavioral: Negative for depressed mood and sleep disturbance. The patient is not nervous/anxious.     PMFS History:  Patient Active Problem List   Diagnosis Date Noted  . ANA positive 03/14/2016  . History of asthma 03/14/2016    Past Medical History:  Diagnosis Date  . Anemia     Family History  Problem Relation Age of Onset  . Eczema Father    History reviewed. No pertinent surgical history. Social History   Social History Narrative  . No narrative on file     Objective: Vital Signs: BP 107/70   Pulse 83   Resp 12   Ht _1  (1.6 m)   Wt 125 lb (56.7 kg)   LMP 10/07/2015   BMI 22.14 kg/m    Physical Exam  Constitutional: She is oriented to person, place, and time. She appears well-developed and well-nourished.  HENT:  Head: Normocephalic and atraumatic.  Eyes: Conjunctivae and EOM are normal.  Neck: Normal range of motion.  Cardiovascular: Normal rate, regular rhythm, normal heart sounds and intact distal pulses.   Pulmonary/Chest: Effort normal and breath sounds normal.  Abdominal: Soft. Bowel sounds are normal.  Lymphadenopathy:    She has no cervical adenopathy.  Neurological: She is alert and oriented to person, place, and time.  Skin: Skin is warm and dry. Capillary refill takes less than 2 seconds.  She  has some hyperpigmented macular lesions on her buttock area and lower extremities and her feet. Peeling of the skin was noted. No new lesions were noted.  Psychiatric: She has a normal mood and affect. Her behavior is normal.  Nursing note and vitals reviewed.    Musculoskeletal Exam: C-spine and thoracic lumbar spine good range of motion shoulders elbow joints wrist joint MCPs PIPs DIPs with good range of motion. Hip  joints knee joints ankles MTPs PIPs DIPs with good range of motion with no synovitis.  CDAI Exam: No CDAI exam completed.    Investigation: Findings:  02/22/2016 CBC, CMP normal, UA negative, CK normal, ESR 1, pain ANCA negative, HLA-B27 negative, G6PD normal   Office Visit on 02/22/2016  Component Date Value Ref Range Status  . WBC 02/22/2016 6.2  3.8 - 10.8 K/uL Final  . RBC 02/22/2016 5.55* 3.80 - 5.10 MIL/uL Final  . Hemoglobin 02/22/2016 13.6  11.7 - 15.5 g/dL Final  . HCT 02/22/2016 43.7  35.0 - 45.0 % Final  . MCV 02/22/2016 78.7* 80.0 - 100.0 fL Final  . MCH 02/22/2016 24.5* 27.0 - 33.0 pg Final  . MCHC 02/22/2016 31.1* 32.0 - 36.0 g/dL Final  . RDW 02/22/2016 17.4* 11.0 - 15.0 % Final  . Platelets 02/22/2016 317  140 - 400 K/uL Final  . MPV 02/22/2016 9.7  7.5 - 12.5 fL Final  . Neutro Abs 02/22/2016 3720  1,500 - 7,800 cells/uL Final  . Lymphs Abs 02/22/2016 1736  850 - 3,900 cells/uL Final  . Monocytes Absolute 02/22/2016 620  200 - 950 cells/uL Final  . Eosinophils Absolute 02/22/2016 62  15 - 500 cells/uL Final  . Basophils Absolute 02/22/2016 62  0 - 200 cells/uL Final  . Neutrophils Relative % 02/22/2016 60  % Final  . Lymphocytes Relative 02/22/2016 28  % Final  . Monocytes Relative 02/22/2016 10  % Final  . Eosinophils Relative 02/22/2016 1  % Final  . Basophils Relative 02/22/2016 1  % Final  . Smear Review 02/22/2016 Criteria for review not met   Final  . Sodium 02/22/2016 140  135 - 146 mmol/L Final  . Potassium 02/22/2016 4.6  3.5 - 5.3 mmol/L Final  . Chloride 02/22/2016 104  98 - 110 mmol/L Final  . CO2 02/22/2016 21  20 - 31 mmol/L Final  . Glucose, Bld 02/22/2016 84  65 - 99 mg/dL Final  . BUN 02/22/2016 11  7 - 25 mg/dL Final  . Creat 02/22/2016 0.82  0.50 - 1.10 mg/dL Final  . Total Bilirubin 02/22/2016 0.5  0.2 - 1.2 mg/dL Final  . Alkaline Phosphatase 02/22/2016 86  33 - 115 U/L Final  . AST 02/22/2016 24  10 - 30 U/L Final  . ALT 02/22/2016 20   6 - 29 U/L Final  . Total Protein 02/22/2016 8.3* 6.1 - 8.1 g/dL Final  . Albumin 02/22/2016 4.4  3.6 - 5.1 g/dL Final  . Calcium 02/22/2016 9.9  8.6 - 10.2 mg/dL Final  . GFR, Est African American 02/22/2016 >89  >=60 mL/min Final  . GFR, Est Non African American 02/22/2016 >89  >=60 mL/min Final  . Color, Urine 02/22/2016 YELLOW  YELLOW Final  . APPearance 02/22/2016 TURBID* CLEAR Final  . Specific Gravity, Urine 02/22/2016 1.026  1.001 - 1.035 Final  . pH 02/22/2016 6.0  5.0 - 8.0 Final  . Glucose, UA 02/22/2016 NEGATIVE  NEGATIVE Final  . Bilirubin Urine 02/22/2016 NEGATIVE  NEGATIVE Final  . Ketones, ur 02/22/2016  TRACE* NEGATIVE Final  . Hgb urine dipstick 02/22/2016 NEGATIVE  NEGATIVE Final  . Protein, ur 02/22/2016 NEGATIVE  NEGATIVE Final  . Nitrite 02/22/2016 NEGATIVE  NEGATIVE Final  . Leukocytes, UA 02/22/2016 NEGATIVE  NEGATIVE Final  . Sed Rate 02/22/2016 1  0 - 20 mm/hr Final  . Total CK 02/22/2016 89  7 - 177 U/L Final  . DNA Result: 02/22/2016 Negative  Negative Final  . Results reviewed by: 02/22/2016 REPORT   Final   Comment: Ileene Hutchinson, Ph.D.,[facility] Senior Director, Molecular Genetics The B27 allele group of the HLA-B locus is present in 2 to 9% of the general population.  About 20% of HLA-B27 carriers develop autoimmune disorders including Ankylosing Spondylitis (AS), Reactive Arthritis, Psoriatic Arthritis, Undifferentiated Oligoarthritis, Uveitis, or Inflammatory Bowel Disease. The highest association is with AS, where approximately 95% of AS patients are HLA-B27 positive. Genetic counseling as needed. Typing performed by PCR and hybridization with sequence specific oligonucleotide probes (SSO) using the FDA-cleared LABType(R) SSO Kit.   Marland Kitchen G-6PDH 02/22/2016 15.1  7.0 - 20.5 U/g Hgb Final  . ANCA Screen 02/22/2016 Negative   Final   Comment:             ** Normal Reference Range: Negative **   ANCA Screen includes evaluation for p-ANCA, c-ANCA  and Atypical p-ANCA.   Marland Kitchen Myeloperoxidase Abs 02/22/2016 <1.0  <1.0 AI Final   Comment:                                 Value   Interpretation                              <1.0 AI: No Antibody Detected                           >or=1.0 AI: Antibody Detected   Autoantibodies to myeloperoxidase (MPO) are commonly associated with the following small-vessel vasculitides: microscopic polyangiitis, polyarteritis nodosa, Churg-Strauss syndrome, necrotizing and crescentic glomerulonephritis and occasionally Wegener's granulomatosis. The perinuclear IFA pattern, (p-ANCA), is based largely on autoantibody to myeloperoxidase which serves as the primary antigen   These autoantibodies are present in active disease state.     . Serine Protease 3 02/22/2016 <1.0  <1.0 AI Final   Comment:                                 Value   Interpretation                              <1.0 AI: No Antibody Detected                           >or=1.0 AI: Antibody Detected   Autoantibodies to proteinase-3 (PR-3) are accepted as characteristic for granulomatosis with polyangiitis (Wegener's), and are detectable in 95% of the histologically proven cases. The cytoplasmic IFA pattern, (c-ANCA), is based largely on autoantibody to PR-3 which serves as the primary antigen.   These autoantibodies are present in active disease state.     . WBC, UA 02/22/2016 0-5  <=5 WBC/HPF Final  . RBC / HPF 02/22/2016 0-2  <=2 RBC/HPF Final  . Squamous Epithelial / LPF 02/22/2016 0-5  <=  5 HPF Final  . Bacteria, UA 02/22/2016 MANY* NONE SEEN HPF Final  . Crystals 02/22/2016 NONE SEEN  NONE SEEN HPF Final  . Casts 02/22/2016 NONE SEEN  NONE SEEN LPF Final  . Yeast 02/22/2016 NONE SEEN  NONE SEEN HPF Final  . Urine-Other 02/22/2016 SEE NOTE   Final    Imaging: No results found.  Speciality Comments: No specialty comments available.    Procedures:  No procedures performed Allergies: Patient has no known allergies.    Assessment / Plan:     Visit Diagnoses: ANA positive - ANA 1:40 speckled, ENA negative. I had detailed discussion regarding her labs all autoimmune workup was negative. At this point I do not believe that she has underlying autoimmune disease.  Rash and other nonspecific skin eruption: Her rash is resolving. She has some hyperpigmented lesions and dry scales from resolving rash. No new lesions were noted. I have advised patient in case she develops a new rash she should see a dermatologist for skin biopsy.  Lower back pain appears to be muscular she has no radiculopathy now. She is good range of motion no point tenderness. I've given her some lumbar spine exercises handout. If she has persistent problems she should notify us.  History of asthma    Other medical problems include history of asthma.  Orders: No orders of the defined types were placed in this encounter.  No orders of the defined types were placed in this encounter.   Face-to-face time spent with patient was 20 minutes. 50% of time was spent in counseling and coordination of care.  Follow-Up Instructions: No Follow-up on file.   Bo Merino, MD  Note - This record has been created using Editor, commissioning.  Chart creation errors have been sought, but may not always  have been located. Such creation errors do not reflect on  the standard of medical care.

## 2016-04-27 ENCOUNTER — Encounter: Payer: Self-pay | Admitting: Rheumatology

## 2016-04-27 ENCOUNTER — Ambulatory Visit (INDEPENDENT_AMBULATORY_CARE_PROVIDER_SITE_OTHER): Payer: BLUE CROSS/BLUE SHIELD | Admitting: Rheumatology

## 2016-04-27 VITALS — BP 107/70 | HR 83 | Resp 12 | Ht 63.0 in | Wt 125.0 lb

## 2016-04-27 DIAGNOSIS — Z8709 Personal history of other diseases of the respiratory system: Secondary | ICD-10-CM | POA: Diagnosis not present

## 2016-04-27 DIAGNOSIS — R768 Other specified abnormal immunological findings in serum: Secondary | ICD-10-CM | POA: Diagnosis not present

## 2016-04-27 DIAGNOSIS — G8929 Other chronic pain: Secondary | ICD-10-CM | POA: Diagnosis not present

## 2016-04-27 DIAGNOSIS — M545 Low back pain, unspecified: Secondary | ICD-10-CM

## 2016-04-27 DIAGNOSIS — R21 Rash and other nonspecific skin eruption: Secondary | ICD-10-CM

## 2016-04-27 NOTE — Patient Instructions (Signed)

## 2017-07-28 IMAGING — US US OB COMP LESS 14 WK
1 series · 15 of 28 positions shown · non-contrast
Comparison: None.

CLINICAL DATA: 24-year-old pregnant female presenting with 3 weeks
of intermittent mid pelvic pain. Spotting 1 day prior. Quantitative
beta HCG [DATE].

EDC by LMP: 07/13/2016, projecting to an expected gestational age of
5 weeks 5 days.
EXAM:
OBSTETRIC <14 WK US AND TRANSVAGINAL OB US
TECHNIQUE: Both transabdominal and transvaginal ultrasound examinations were
performed for complete evaluation of the gestation as well as the
maternal uterus, adnexal regions, and pelvic cul-de-sac.
Transvaginal technique was performed to assess early pregnancy.

[Series 1: us ob comp less 14 wk · 15 of 54 slices shown]
[im 1/54]
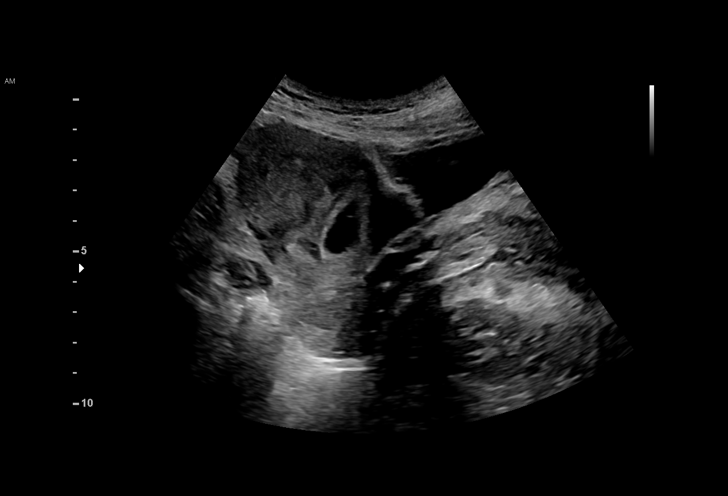
[im 4/54]
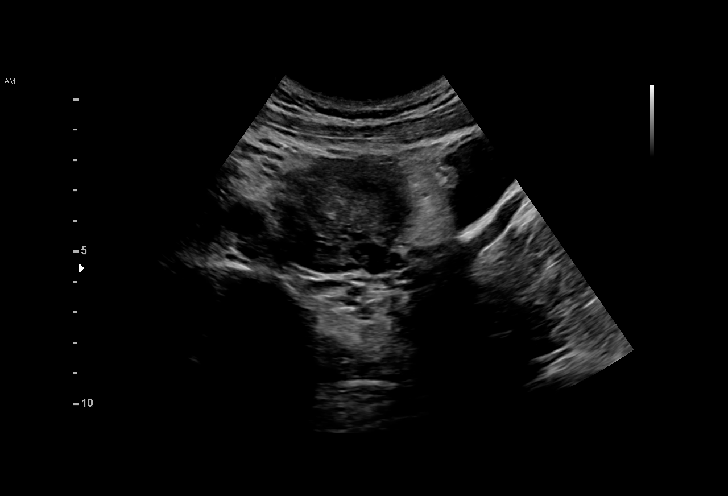
[im 8/54]
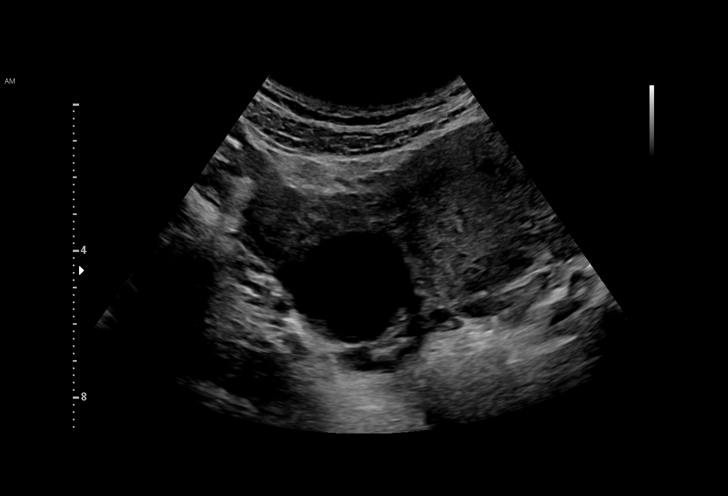
[im 12/54]
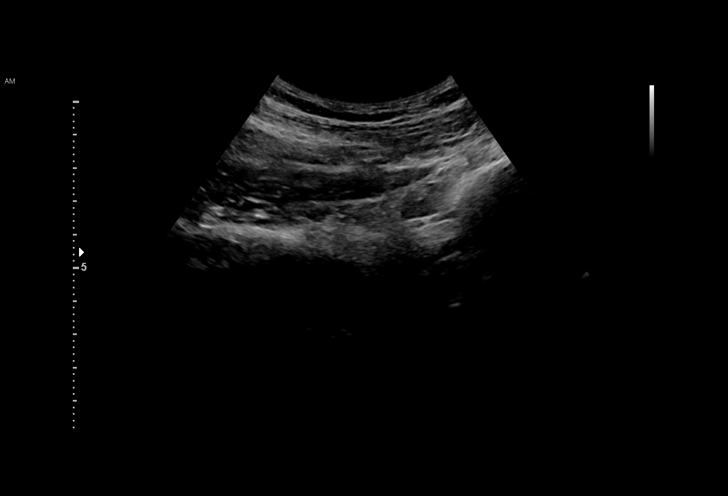
[im 16/54]
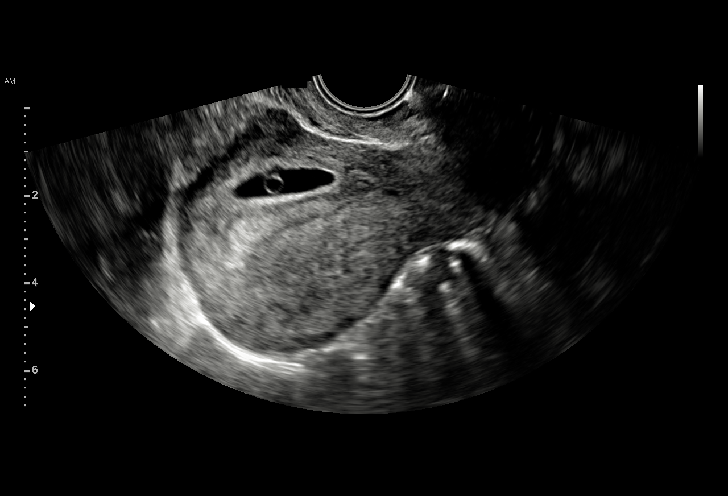
[im 20/54]
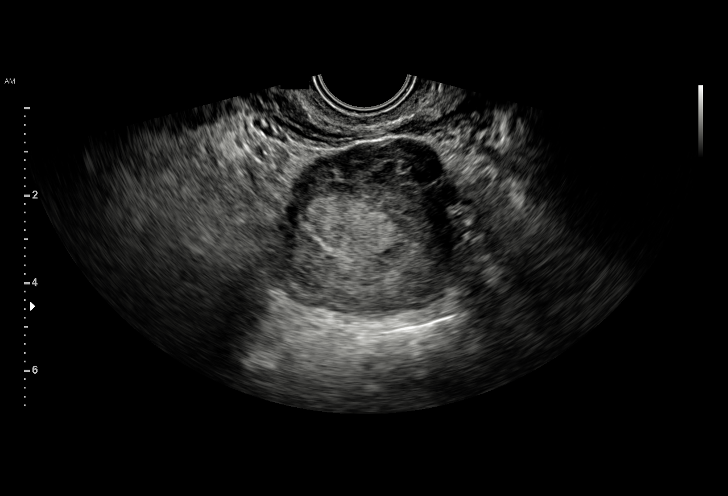
[im 24/54]
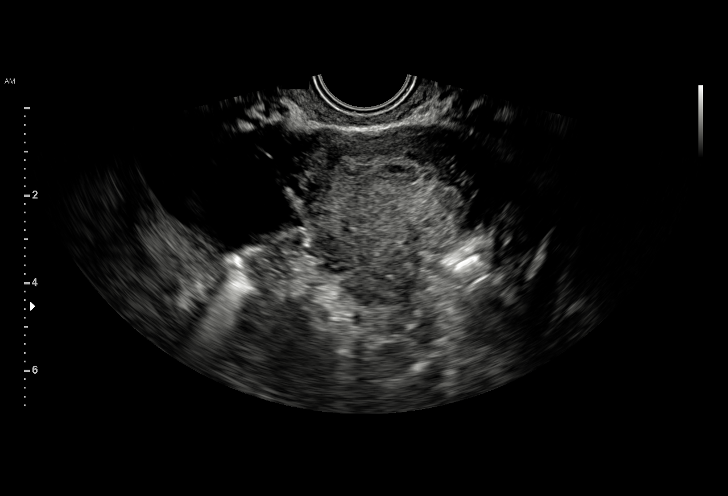
[im 28/54]
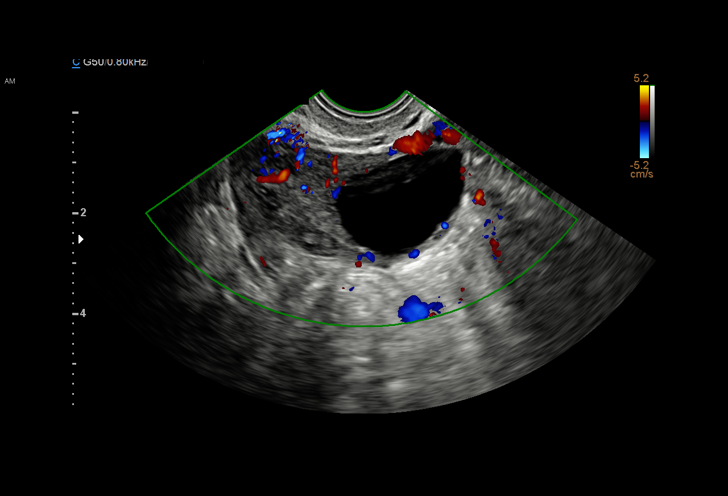
[im 30/54]
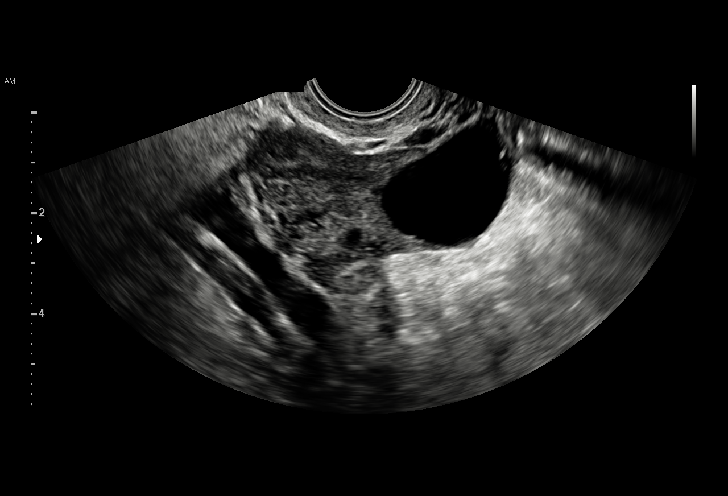
[im 34/54]
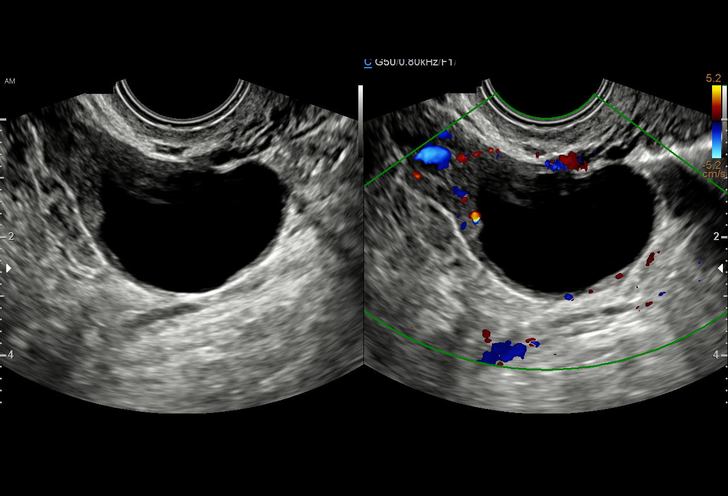
[im 38/54]
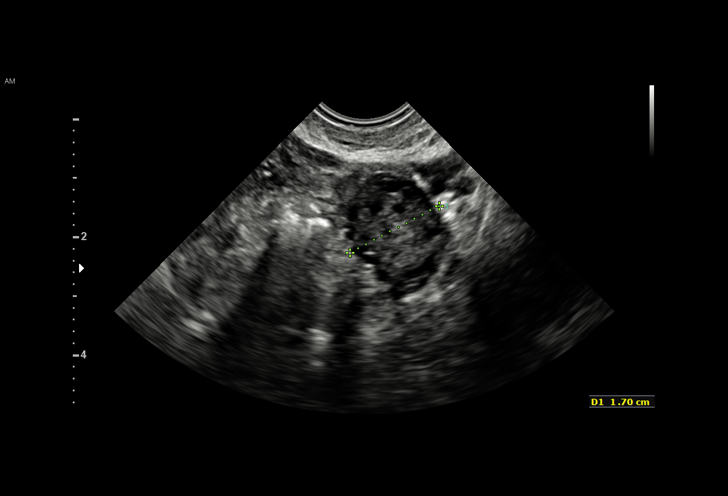
[im 42/54]
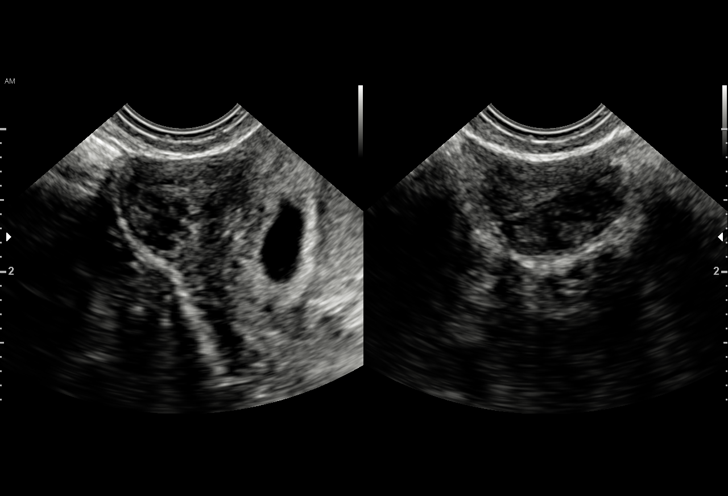
[im 46/54]
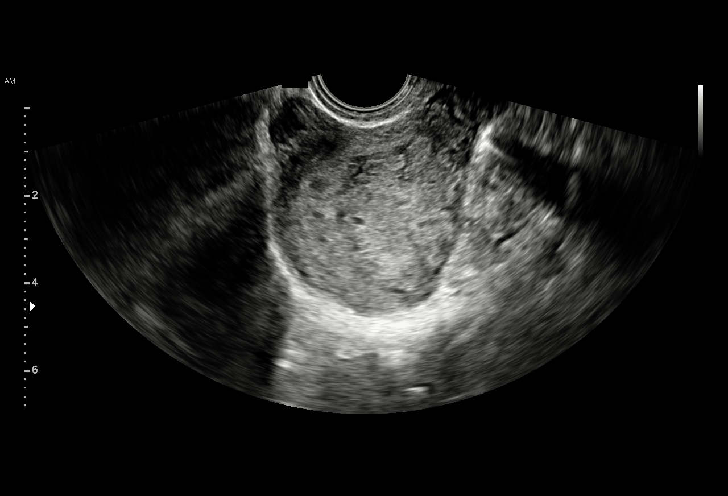
[im 50/54]
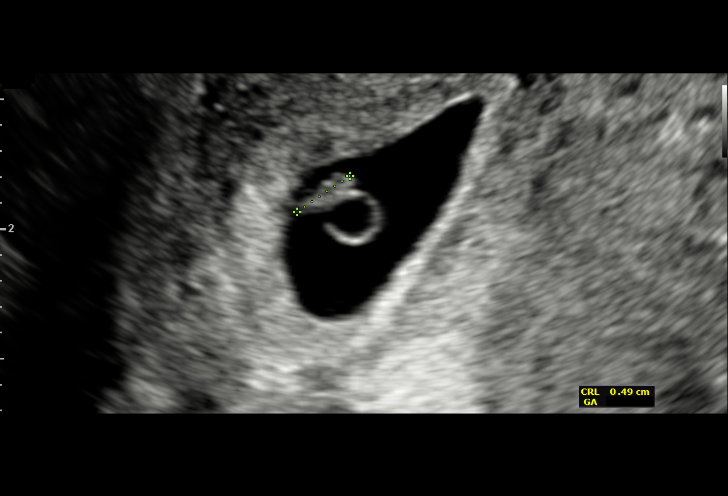
[im 54/54]
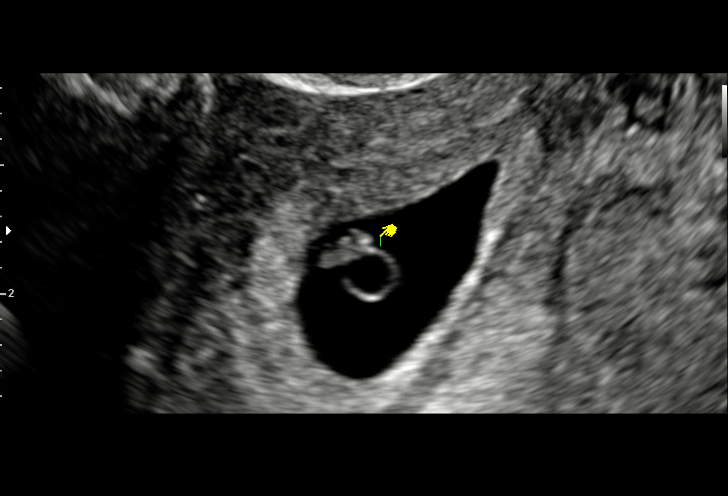

[15 of 28 positions shown; findings below may reference images not displayed]

FINDINGS: Intrauterine gestational sac: Single intrauterine gestational sac
appears normal in size, shape and position.

Yolk sac:  Present.

Embryo:  Present.

Embryonic Cardiac Activity: Present.

Embryonic Heart Rate: 110  bpm

CRL:  1.9  mm   6 w   1 d                  US EDC: 07/10/2016

Subchorionic hemorrhage:  None visualized.

Maternal uterus/adnexae: Uterus is anteverted. There is a solitary
1.6 x 1.3 x 2.0 cm subserosal fibroid demonstrated in the left
anterior uterine body. Right ovary measures 5.3 x 3.7 x 4.5 cm and
contains a simple 3.2 x 2.1 x 3.2 cm cyst, likely representing a
corpus luteal cyst. Left ovary measures 2.6 x 1.8 x 1.7 cm. No
suspicious ovarian or adnexal masses.
IMPRESSION: 1. Single living intrauterine gestation at 6 weeks 1 day by
crown-rump length, with no significant discrepancy with the expected
gestational age of 5 weeks 5 days by provided menstrual dating.
2. No perigestational bleed.
3. Embryonic heart rate 110 beats per minute, borderline low,
probably normal given the early gestational age. Consider a
follow-up obstetric scan in 4 weeks to document appropriate fetal
growth.
4. Simple 3.2 cm right ovarian cyst, likely a corpus luteal cyst. No
suspicious ovarian or adnexal masses.
5. Solitary subserosal 2.0 cm left anterior uterine body fibroid.

## 2018-07-11 DIAGNOSIS — R509 Fever, unspecified: Secondary | ICD-10-CM | POA: Diagnosis not present

## 2018-07-11 DIAGNOSIS — R0789 Other chest pain: Secondary | ICD-10-CM | POA: Diagnosis not present

## 2018-07-11 DIAGNOSIS — R110 Nausea: Secondary | ICD-10-CM | POA: Diagnosis not present

## 2018-07-11 DIAGNOSIS — Z20828 Contact with and (suspected) exposure to other viral communicable diseases: Secondary | ICD-10-CM | POA: Diagnosis not present

## 2018-07-17 ENCOUNTER — Other Ambulatory Visit: Payer: Self-pay

## 2018-07-17 ENCOUNTER — Emergency Department (HOSPITAL_COMMUNITY): Payer: BC Managed Care – PPO

## 2018-07-17 ENCOUNTER — Emergency Department (HOSPITAL_COMMUNITY)
Admission: EM | Admit: 2018-07-17 | Discharge: 2018-07-17 | Disposition: A | Discharge status: Home / Self Care | Payer: BC Managed Care – PPO | Attending: Emergency Medicine | Admitting: Emergency Medicine

## 2018-07-17 DIAGNOSIS — R110 Nausea: Secondary | ICD-10-CM | POA: Insufficient documentation

## 2018-07-17 DIAGNOSIS — R0789 Other chest pain: Secondary | ICD-10-CM | POA: Diagnosis not present

## 2018-07-17 DIAGNOSIS — F17290 Nicotine dependence, other tobacco product, uncomplicated: Secondary | ICD-10-CM | POA: Insufficient documentation

## 2018-07-17 DIAGNOSIS — R079 Chest pain, unspecified: Secondary | ICD-10-CM | POA: Diagnosis not present

## 2018-07-17 DIAGNOSIS — R002 Palpitations: Secondary | ICD-10-CM | POA: Diagnosis not present

## 2018-07-17 LAB — CBC
HCT: 38.5 % (ref 36.0–46.0)
Hemoglobin: 12.2 g/dL (ref 12.0–15.0)
MCH: 25.4 pg — ABNORMAL LOW (ref 26.0–34.0)
MCHC: 31.7 g/dL (ref 30.0–36.0)
MCV: 80.2 fL (ref 80.0–100.0)
Platelets: 292 10*3/uL (ref 150–400)
RBC: 4.8 MIL/uL (ref 3.87–5.11)
RDW: 17.2 % — ABNORMAL HIGH (ref 11.5–15.5)
WBC: 5.8 10*3/uL (ref 4.0–10.5)
nRBC: 0 % (ref 0.0–0.2)

## 2018-07-17 LAB — BASIC METABOLIC PANEL
Anion gap: 9 (ref 5–15)
BUN: 5 mg/dL — ABNORMAL LOW (ref 6–20)
CO2: 21 mmol/L — ABNORMAL LOW (ref 22–32)
Calcium: 9 mg/dL (ref 8.9–10.3)
Chloride: 107 mmol/L (ref 98–111)
Creatinine, Ser: 0.71 mg/dL (ref 0.44–1.00)
GFR calc Af Amer: >60 mL/min (ref >60)
GFR calc non Af Amer: >60 mL/min (ref >60)
Glucose, Bld: 103 mg/dL — ABNORMAL HIGH (ref 70–99)
Potassium: 3.8 mmol/L (ref 3.5–5.1)
Sodium: 137 mmol/L (ref 135–145)

## 2018-07-17 LAB — TROPONIN I (HIGH SENSITIVITY): Troponin I (High Sensitivity): <2 ng/L (ref <18)

## 2018-07-17 LAB — I-STAT BETA HCG BLOOD, ED (MC, WL, AP ONLY): I-stat hCG, quantitative: <5 m[IU]/mL (ref <5)

## 2018-07-17 MED ORDER — SODIUM CHLORIDE 0.9% FLUSH: 3.0000 mL | Freq: Once | INTRAVENOUS | Status: DC

## 2018-07-17 NOTE — ED Provider Notes (Signed)
[facility]   CSN: [id number] Arrival date & time: 07/17/18  1412   History   Chief Complaint Chief Complaint  Patient presents with  . Chest Pain   HPI Elizabeth Strong is a 28 y.o. female with past medical history significant for asthma, anemia who presents for evaluation of chest pain.  Patient states she has had chest pain x4 years.  She has not been seen by her primary care provider for this.  Patient states she will centralized nonradiating chest pain which normally last about 1/2 minutes and self resolves.  Patient states she will occasionally have palpitations and nausea with this however denies any diaphoresis, dizziness.  Denies history of hypertension, hyperlipidemia, family history of early MI, diabetes.  She denies any current chest pain.  Denies any recent injuries or trauma.  Patient states she did have a low-grade fever, 100.0 last Friday as well as nonproductive cough.  Patient was seen by urgent care and was negative for COVID at that time.  Denies additional aggravating or alleviating factors. CP non exertional and nonradicular nature.  No associated hematemesis, lower extremity edema, erythema or warmth.  No history of PE or DVT.  Denies any illicit drug use.  Last episode of chest pain 2 hours PTA.  He did have prior episode of chest pain 4 hours prior to that.  History obtained from patient and past medical record.  No interpreter was used.     HPI  Past Medical History:  Diagnosis Date  . Anemia     Patient Active Problem List   Diagnosis Date Noted  . ANA positive 03/14/2016  . History of asthma 03/14/2016    No past surgical history on file.   OB History    Gravida  2   Para      Term      Preterm      AB  1   Living        SAB  1   TAB      Ectopic      Multiple      Live Births               Home Medications    Prior to Admission medications   Medication Sig Start Date End  Date Taking? Authorizing Provider  glycopyrrolate (ROBINUL) 1 MG tablet Take 1 tablet (1 mg total) by mouth 3 (three) times daily. Patient not taking: Reported on 02/22/2016 11/16/15   Judeth HornLawrence, Erin, NP  MedroxyPROGESTERone Acetate 150 MG/ML SUSY  04/11/16   [provider]  metroNIDAZOLE (FLAGYL) 500 MG tablet Take 1 tablet (500 mg total) by mouth 2 (two) times daily. Patient not taking: Reported on 02/22/2016 11/16/15   Judeth HornLawrence, Erin, NP  promethazine (PHENERGAN) 25 MG tablet Take 1 tablet (25 mg total) by mouth every 6 (six) hours as needed for nausea or vomiting. Patient not taking: Reported on 02/22/2016 11/16/15   Judeth HornLawrence, Erin, NP  terconazole (TERAZOL 3) 0.8 % vaginal cream Place 1 applicator vaginally at bedtime. Patient not taking: Reported on 02/22/2016 11/16/15   Judeth HornLawrence, Erin, NP    Family History Family History  Problem Relation Age of Onset  . Eczema Father     Social History Social History   Tobacco Use  . Smoking status: Current Some Day Smoker    Packs/day: 2.00    Years: 7.00    Pack years: 14.00    Types: Cigars    Last attempt to  quit: 2017    Years since quitting: 3.4  . Smokeless tobacco: Never Used  Substance Use Topics  . Alcohol use: Yes    Comment: drinks socially  . Drug use: No     Allergies   Patient has no known allergies.   Review of Systems Review of Systems  Constitutional: Negative.   HENT: Negative.   Eyes: Negative.   Respiratory: Negative.   Cardiovascular: Positive for chest pain. Negative for palpitations and leg swelling.  Gastrointestinal: Negative.   Genitourinary: Negative.   Musculoskeletal: Negative.   Neurological: Negative.   All other systems reviewed and are negative.  Physical Exam Updated Vital Signs BP 125/80 (BP Location: Right Arm)   Pulse 99   Temp 99.2 F (37.3 C) (Oral)   Resp 18   SpO2 100%   Physical Exam Vitals signs and nursing note reviewed.  Constitutional:      General: She is  not in acute distress.    Appearance: She is well-developed. She is not ill-appearing, toxic-appearing or diaphoretic.  HENT:     Head: Atraumatic.     Jaw: There is normal jaw occlusion.     Nose: Nose normal.     Mouth/Throat:     Lips: Pink.     Mouth: Mucous membranes are moist.     Pharynx: Oropharynx is clear. Uvula midline.     Tonsils: No tonsillar exudate or tonsillar abscesses. 0 on the right. 0 on the left.     Comments: Posterior oropharynx clear.  Mucous membranes moist.  Eyes:     Pupils: Pupils are equal, round, and reactive to light.  Neck:     Musculoskeletal: Full passive range of motion without pain and normal range of motion.     Vascular: No carotid bruit or JVD.     Trachea: Trachea and phonation normal.     Comments: No neck stiffness or neck rigidity. Cardiovascular:     Rate and Rhythm: Normal rate.     Pulses: Normal pulses.          Dorsalis pedis pulses are 2+ on the right side and 2+ on the left side.       Posterior tibial pulses are 2+ on the right side and 2+ on the left side.     Heart sounds: Normal heart sounds.     Comments: No murmurs, rubs or gallops. Pulmonary:     Effort: Pulmonary effort is normal. No respiratory distress.     Breath sounds: Normal breath sounds and air entry.     Comments: Clear to auscultation bilateral without wheeze, rhonchi or rales.  No accessory muscle usage.  Speaks in full sentences without difficulty. Chest:       Comments: Pain reproducible to palpation to right sternal border.  No overlying swelling, crepitance or edema. Abdominal:     General: There is no distension.     Comments: Soft, nontender without rebound or guarding.  Musculoskeletal: Normal range of motion.     Comments: Lower extremities without edema, erythema, ecchymosis or warmth.  Negative Homans sign bilaterally.  Lymphadenopathy:     Cervical: No cervical adenopathy.  Skin:    General: Skin is warm and dry.     Comments: No rashes or  lesions.  Neurological:     Mental Status: She is alert.    ED Treatments / Results  Labs (all labs ordered are listed, but only abnormal results are displayed) Labs Reviewed  BASIC METABOLIC PANEL - Abnormal; Notable for  the following components:      Result Value   CO2 21 (*)    Glucose, Bld 103 (*)    BUN 5 (*)    All other components within normal limits  CBC - Abnormal; Notable for the following components:   MCH 25.4 (*)    RDW 17.2 (*)    All other components within normal limits  TROPONIN I (HIGH SENSITIVITY)  TROPONIN I (HIGH SENSITIVITY)  I-STAT BETA HCG BLOOD, ED ([facility], [facility], [facility] ONLY)    EKG EKG Interpretation  Date/Time:  Wednesday July 17 2018 14:15:21 EDT Ventricular Rate:  97 PR Interval:  124 QRS Duration: 82 QT Interval:  336 QTC Calculation: 426 R Axis:   87 Text Interpretation:  Normal sinus rhythm Normal ECG Confirmed by Gerlene Fee [phone]) on 07/17/2018 5:49:14 PM   Radiology Dg Chest 2 View  Result Date: 07/17/2018 CLINICAL DATA:  Chest pain EXAM: CHEST - 2 VIEW COMPARISON:  None. FINDINGS: Lungs are clear. Heart size and pulmonary vascularity are normal. No adenopathy. No pneumothorax. No bone lesions. IMPRESSION: No edema or consolidation. Electronically Signed   By: Lowella Grip III M.D.   On: 07/17/2018 15:38    Procedures Procedures (including critical care time)  Medications Ordered in ED Medications  sodium chloride flush (NS) 0.9 % injection 3 mL (has no administration in time range)   Initial Impression / Assessment and Plan / ED Course  I have reviewed the triage vital signs and the nursing notes.  Pertinent labs & imaging results that were available during my care of the patient were reviewed by me and considered in my medical decision making (see chart for details).  28 year old female presents for evaluation of chest pain.  Afebrile, nonseptic, non-ill-appearing.  Chest pain x4 years.  Intermittent in nature.  No current  chest pain.  Non-radiating, no associated diaphoresis, dizziness or lightheadedness.  Has had occasional nausea palpitations however not over the last month.  Last episode 2-3 hours PTA.  Self resolved after 2 minutes.  States similar episodes over the last 4 years.  Has not been seen by PCP for this.  No associated lower extremity edema, erythema, ecchymosis or warmth.  Nontender bilateral calves.  No history of PE or DVT.  Patient is not tachycardic, tachypneic or hypoxic.  Pain reproducible to palpation to her right sternal border.  No overlying skin changes to chest wall.  She did have low-grade temperature with nonproductive cough approximately 5 days ago.  Seen by urgent care with a negative COVID test at that time.  Heart and lungs clear.   PERC negative, Wells criteria negative, HEART score 0  Labs and imaging obtained from triage and personally reviewed:  CBC without leukocytosis, metabolic panel without electrolyte, renal or liver abnormality, hCG negative, high-sensitivity troponin negative, EKG without infiltrates, cardiomegaly, pulmonary edema, pneumothorax.  EKG without ST/T changes, no STEMI.   On reevaluation patient without any current pain.  I have low suspicion for ACS, PE, dissection, [place], pericarditis, myocarditis, reflux.  Patient is to be discharged with recommendation to follow up with PCP in regards to today's hospital visit. Chest pain is not likely of cardiac or pulmonary etiology d/t presentation, PERC negative, VSS, no tracheal deviation, no JVD or new murmur, RRR, breath sounds equal bilaterally, EKG without acute abnormalities, negative troponin, and negative CXR. Pt has been advised to return to the ED if CP becomes exertional, associated with diaphoresis or nausea, radiates to left jaw/arm, worsens or becomes concerning in any  way. Pt appears reliable for follow up and is agreeable to discharge.     Final Clinical Impressions(s) / ED Diagnoses   Final diagnoses:   Atypical chest pain    ED Discharge Orders    None       Tine Mabee A, PA-C 07/17/18 1754    Sabas SousBero, Michael M, MD 07/17/18 1842

## 2018-07-17 NOTE — Discharge Instructions (Signed)
Evaluated today for chest pain.  Your work-up in the emergency department was negative.  Follow-up with PCP and cardiology if you continue to have symptoms.

## 2018-07-17 NOTE — ED Triage Notes (Signed)
Pt herefr om home with c/o chest pain that has got worse over the past 2 weeks, pt states that she has had pain on and off for 4 years , pt does smoke but stopped 2 weeks ago

## 2018-07-17 NOTE — ED Notes (Signed)
Pt verbalized understanding of discharge paperwork and follow-up care.  °

## 2018-08-01 ENCOUNTER — Telehealth: Payer: Self-pay | Admitting: Internal Medicine

## 2018-08-01 NOTE — Telephone Encounter (Signed)
° ° °  COVID-19 Pre-Screening Questions: ° °• In the past 7 to 10 days have you had a cough,  shortness of breath, headache, congestion, fever (100 or greater) body aches, chills, sore throat, or sudden loss of taste or sense of smell? no °• Have you been around anyone with known Covid 19. no °• Have you been around anyone who is awaiting Covid 19 test results in the past 7 to 10 days? no °• Have you been around anyone who has been exposed to Covid 19, or has mentioned symptoms of Covid 19 within the past 7 to 10 days? no ° °If you have any concerns/questions about symptoms patients report during screening (either on the phone or at threshold). Contact the provider seeing the patient or DOD for further guidance.  If neither are available contact a member of the leadership team. ° ° °I called pt to confirm her appt for 08-02-18 with Dr Acharya. ° ° °   ° ° ° ° ° °

## 2018-08-02 ENCOUNTER — Ambulatory Visit (INDEPENDENT_AMBULATORY_CARE_PROVIDER_SITE_OTHER): Payer: BC Managed Care – PPO | Admitting: Internal Medicine

## 2018-08-02 ENCOUNTER — Other Ambulatory Visit: Payer: Self-pay

## 2018-08-02 VITALS — BP 117/81 | HR 77 | Temp 97.5°F | Ht 64.0 in | Wt 117.4 lb

## 2018-08-02 DIAGNOSIS — Z01818 Encounter for other preprocedural examination: Secondary | ICD-10-CM

## 2018-08-02 DIAGNOSIS — R079 Chest pain, unspecified: Secondary | ICD-10-CM | POA: Diagnosis not present

## 2018-08-02 DIAGNOSIS — R002 Palpitations: Secondary | ICD-10-CM | POA: Diagnosis not present

## 2018-08-02 MED ORDER — METOPROLOL TARTRATE 50 MG PO TABS: 50.0000 mg | ORAL_TABLET | Freq: Once | ORAL | 0 refills | Status: AC

## 2018-08-02 NOTE — Progress Notes (Signed)
Cardiology Office Note:    Date:  08/02/2018   ID:  Elizabeth Strong, DOB 1990/04/18, MRN [id number]  PCP:  Patient, No Pcp Per  Cardiologist:  No primary care provider on file.  Electrophysiologist:  None   Referring MD: No ref. provider found   Chief Complaint: Chest pain  History of Present Illness:    Elizabeth Strong is a 28 y.o. female with a hx of asthma, anemia who presents for evaluation of chest pain.   Started 4 years ago. Feels like clutch in chest, 10 times a day, lasting seconds and going away on its own. Occurs while walking up and down the steps, or during stressful situation. Positional change helps. Not worsened by deep breathing. Chest pain with walking dog. Left of substernal. Feels like a grabbing and pressure. No association with food or eating. Occurs every day. She will also have daily palpitations, her pain starts and then she notices palpitations. Pounding, not irregular. Chest pain will wake her up at night. Feels some relation to anxiety, job is very stressful.  fhx - mom htn dm MI, chf Dad htn  No early mi,  Mgm - 79 died mi  No other SCD  One episode of presyncope 2012 very hot at work. No lifetime syncope.  Past Medical History:  Diagnosis Date  . Anemia     No past surgical history on file.  Current Medications: No outpatient medications have been marked as taking for the 08/02/18 encounter (Office Visit) with Elouise Munroe, MD.     Allergies:   Patient has no known allergies.   Social History   Socioeconomic History  . Marital status: Single    Spouse name: Not on file  . Number of children: Not on file  . Years of education: Not on file  . Highest education level: Not on file  Occupational History  . Not on file  Social Needs  . Financial resource strain: Not on file  . Food insecurity    Worry: Not on file    Inability: Not on file  . Transportation needs    Medical: Not on file    Non-medical: Not on file  Tobacco Use  .  Smoking status: Current Some Day Smoker    Packs/day: 2.00    Years: 7.00    Pack years: 14.00    Types: Cigars    Last attempt to quit: 2017    Years since quitting: 3.5  . Smokeless tobacco: Never Used  Substance and Sexual Activity  . Alcohol use: Yes    Comment: drinks socially  . Drug use: No  . Sexual activity: Not on file  Lifestyle  . Physical activity    Days per week: Not on file    Minutes per session: Not on file  . Stress: Not on file  Relationships  . Social Herbalist on phone: Not on file    Gets together: Not on file    Attends religious service: Not on file    Active member of club or organization: Not on file    Attends meetings of clubs or organizations: Not on file    Relationship status: Not on file  Other Topics Concern  . Not on file  Social History Narrative  . Not on file     Family History: The patient's family history includes Eczema in her father.  ROS:   Please see the history of present illness.    All other systems reviewed and  are negative.  EKGs/Labs/Other Studies Reviewed:    The following studies were reviewed today:  EKG:  Sinus rhythm with sinus arrhythmia.  Recent Labs: 07/17/2018: BUN 5; Creatinine, Ser 0.71; Hemoglobin 12.2; Platelets 292; Potassium 3.8; Sodium 137  Recent Lipid Panel No results found for: CHOL, TRIG, HDL, CHOLHDL, VLDL, LDLCALC, LDLDIRECT  Physical Exam:    VS:  BP 117/81   Pulse 77   Temp (!) 97.5 F (36.4 C)   Ht 5\' 4"  (1.626 m)   Wt 117 lb 6.4 oz (53.3 kg)   SpO2 99%   BMI 20.15 kg/m     Wt Readings from Last 5 Encounters:  08/02/18 117 lb 6.4 oz (53.3 kg)  04/27/16 125 lb (56.7 kg)  02/22/16 123 lb (55.8 kg)  11/16/15 124 lb (56.2 kg)     Constitutional: No acute distress Eyes: sclera non-icteric, normal conjunctiva and lids ENMT: normal dentition, moist mucous membranes Cardiovascular: regular rhythm, normal rate, no murmurs. S1 and S2 normal. Radial pulses normal  bilaterally. No jugular venous distention.  Respiratory: clear to auscultation bilaterally GI : normal bowel sounds, soft and nontender. No distention.   MSK: extremities warm, well perfused. No edema.  NEURO: grossly nonfocal exam, moves all extremities. PSYCH: alert and oriented x 3, normal mood and affect.   ASSESSMENT:    1. Chest pain, moderate coronary artery risk   2. Pre-op testing    PLAN:    Given her young age, we need to exclude congenital structural abnormalities and coronary anomalies. Will perform CCTA and echocardiogram.   We will investigate palpitations as needed, patient thinks they are primarily related to anxiety but would have low threshold for holter monitor.    TIME SPENT WITH PATIENT: 45 minutes of direct patient care and review of medical record. More than 50% of that time was spent on coordination of care and counseling regarding chest pain, medical and family history and workup.  Weston BrassGayatri Natajah Derderian, MD [place]  [facility]   Medication Adjustments/Labs and Tests Ordered: Current medicines are reviewed at length with the patient today.  Concerns regarding medicines are outlined above.  Orders Placed This Encounter  Procedures  . CT CORONARY MORPH W/CTA COR W/SCORE W/CA W/CM &/OR WO/CM  . CT CORONARY FRACTIONAL FLOW RESERVE DATA PREP  . CT CORONARY FRACTIONAL FLOW RESERVE FLUID ANALYSIS  . Basic metabolic panel  . EKG 12-Lead  . ECHOCARDIOGRAM COMPLETE   Meds ordered this encounter  Medications  . metoprolol tartrate (LOPRESSOR) 50 MG tablet    Sig: Take 1 tablet (50 mg total) by mouth once for 1 dose. TAKE TWO HOUR PRIOR TO  SCHEDULE  CORONARY CT ANGIOGRAM    Dispense:  1 tablet    Refill:  0    Patient Instructions  Medication Instructions:  SEE INSTRUCTION SHEET FOR CCTA   If you need a refill on your cardiac medications before your next appointment, please call your pharmacy.   Lab work:  SEE INSTRUCTION SHEET FOR CCTA  I   Testing/Procedures: Will be  Schedule at [facility] Your physician has requested that you have an echocardiogram. Echocardiography is a painless test that uses sound waves to create images of your heart. It provides your doctor with information about the size and shape of your heart and how well your heart's chambers and valves are working. This procedure takes approximately one hour. There are no restrictions for this procedure.  And Will be schedule at [facility] -  once  We have insurance prior  authoriaztion Your physician has requested that you have cardiac CTA. Cardiac computed tomography (CT) is a painless test that uses an x-ray machine to take clear, detailed pictures of your heart. For further information please visit [email or web address]. Please follow instruction sheet as given.     Follow-Up: At [facility], you and your health needs are our priority.  As part of our continuing mission to provide you with exceptional heart care, we have created designated Provider Care Teams.  These Care Teams include your primary Cardiologist (physician) and Advanced Practice Providers (APPs -  Physician Assistants and Nurse Practitioners) who all work together to provide you with the care you need, when you need it. . You will need a follow up appointment in AUG 2020.  Please call our office 2 months in advance to schedule this appointment.  You may see Parke PoissonGayatri A Tracer Gutridge, MD*  Any Other Special Instructions Will Be Listed Below (If Applicable).    INSTRUCTIONS FOR  CORONARY CTA    Please [facility] of [facility] at (30-45 minutes prior to test start time)  [facility] [address] [place], [place] [address] [phone]  Proceed to the [facility] Radiology Department (First Floor).  Please follow these instructions carefully (unless otherwise directed):  PLEASE HAVE LABS - BMP  AT LEAST ONE  WEEK PRIOR TO TEST  IF TEST IS SCHEDULE IN AUG 2020  On the Night Before the Test: . Drink plenty of water. . Do not consume any caffeinated/decaffeinated beverages or chocolate 12 hours prior to your test. . Do not take any antihistamines 12 hours prior to your test.    On the Day of the Test: . Drink plenty of water. Do not drink any water within one hour of the test. . Do not eat any food 4 hours prior to the test. . You may take your regular medications prior to the test. . - Take 50 mg of lopressor (metoprolol) TWO hour before the test.   After the Test: . Drink plenty of water. . After receiving IV contrast, you may experience a mild flushed feeling. This is normal. . On occasion, you may experience a mild rash up to 24 hours after the test. This is not dangerous. If this occurs, you can take Benadryl 25 mg and increase your fluid intake. . If you experience trouble breathing, this can be serious. If it is severe call 911 IMMEDIATELY. If it is mild, please call our office.

## 2018-08-02 NOTE — Patient Instructions (Addendum)
Medication Instructions:  SEE INSTRUCTION SHEET FOR CCTA   If you need a refill on your cardiac medications before your next appointment, please call your pharmacy.   Lab work:  SEE INSTRUCTION SHEET FOR CCTA  I  Testing/Procedures: Will be  Schedule at [place] has requested that you have an echocardiogram. Echocardiography is a painless test that uses sound waves to create images of your heart. It provides your doctor with information about the size and shape of your heart and how well your heart's chambers and valves are working. This procedure takes approximately one hour. There are no restrictions for this procedure.  And Will be schedule at [facility] Radiology - once  We have insurance prior  authoriaztion Your physician has requested that you have cardiac CTA. Cardiac computed tomography (CT) is a painless test that uses an x-ray machine to take clear, detailed pictures of your heart. For further information please visit [email or web address]. Please follow instruction sheet as given.     Follow-Up: At [facility], you and your health needs are our priority.  As part of our continuing mission to provide you with exceptional heart care, we have created designated Provider Care Teams.  These Care Teams include your primary Cardiologist (physician) and Advanced Practice Providers (APPs -  Physician Assistants and Nurse Practitioners) who all work together to provide you with the care you need, when you need it. . You will need a follow up appointment in AUG 2020.  Please call our office 2 months in advance to schedule this appointment.  You may see Elouise Munroe, MD*  Any Other Special Instructions Will Be Listed Below (If Applicable).    INSTRUCTIONS FOR  CORONARY CTA    Please arrive at the [facility] main entrance of [facility] at (30-45 minutes prior to test start time)  [facility] [place], [place] [address] [phone]  Proceed to the [facility] Radiology Department (First Floor).  Please follow these instructions carefully (unless otherwise directed):  PLEASE HAVE LABS - BMP  AT LEAST ONE WEEK PRIOR TO TEST  IF TEST IS SCHEDULE IN AUG 2020  On the Night Before the Test: . Drink plenty of water. . Do not consume any caffeinated/decaffeinated beverages or chocolate 12 hours prior to your test. . Do not take any antihistamines 12 hours prior to your test.    On the Day of the Test: . Drink plenty of water. Do not drink any water within one hour of the test. . Do not eat any food 4 hours prior to the test. . You may take your regular medications prior to the test. . - Take 50 mg of lopressor (metoprolol) TWO hour before the test.   After the Test: . Drink plenty of water. . After receiving IV contrast, you may experience a mild flushed feeling. This is normal. . On occasion, you may experience a mild rash up to 24 hours after the test. This is not dangerous. If this occurs, you can take Benadryl 25 mg and increase your fluid intake. . If you experience trouble breathing, this can be serious. If it is severe call 911 IMMEDIATELY. If it is mild, please call our office.

## 2018-08-12 ENCOUNTER — Other Ambulatory Visit (HOSPITAL_COMMUNITY): Payer: BC Managed Care – PPO

## 2018-08-28 ENCOUNTER — Telehealth: Payer: Self-pay | Admitting: *Deleted

## 2018-08-28 NOTE — Telephone Encounter (Signed)
A message was left, RU:[id number] change. Please place the patient on  Dr.Acharya's  Pa's schedule.

## 2018-08-30 ENCOUNTER — Telehealth: Payer: Self-pay

## 2018-08-30 NOTE — Telephone Encounter (Signed)
Left message for pt to call the office back about rescheduling appointment.

## 2018-09-05 ENCOUNTER — Telehealth: Payer: BC Managed Care – PPO | Admitting: Internal Medicine

## 2018-09-15 NOTE — Progress Notes (Deleted)
Cardiology Office Note:    Date:  09/15/2018   ID:  Elizabeth Strong, DOB October 22, 1990, MRN [id number]  PCP:  Patient, No Pcp Per  Cardiologist:  No primary care provider on file.  Electrophysiologist:  None   Referring MD: No ref. provider found   No chief complaint on file. ***  History of Present Illness:    Elizabeth Strong is a 28 y.o. female with a hx of asthma, anemia who presents for follow-up evaluation of chest pain.  She was last seen on 08/02/2018 by Dr. Jacques NavyAcharya.  At that visit, she reported having chest pain over the last 4 years.  She described that it felt like pressure or grabbing in the chest occurring 10 times a day lasting seconds and resolving spontaneously.  Occurs while walking up and down steps or during stress.  Also reported daily palpitations that she described as feeling like her heart was pounding.  She felt this was related related to anxiety/stress.  Plan was to exclude congenital structural abnormalities or coronary abnormalities with coronary CTA and echocardiogram.  These studies have not yet been performed.  Past Medical History:  Diagnosis Date  . Anemia     No past surgical history on file.  Current Medications: No outpatient medications have been marked as taking for the 09/16/18 encounter (Appointment) with Little IshikawaSchumann, Mckaila Duffus L, MD.     Allergies:   Patient has no known allergies.   Social History   Socioeconomic History  . Marital status: Single    Spouse name: Not on file  . Number of children: Not on file  . Years of education: Not on file  . Highest education level: Not on file  Occupational History  . Not on file  Social Needs  . Financial resource strain: Not on file  . Food insecurity    Worry: Not on file    Inability: Not on file  . Transportation needs    Medical: Not on file    Non-medical: Not on file  Tobacco Use  . Smoking status: Current Some Day Smoker    Packs/day: 2.00    Years: 7.00    Pack years: 14.00   Types: Cigars    Last attempt to quit: 2017    Years since quitting: 3.6  . Smokeless tobacco: Never Used  Substance and Sexual Activity  . Alcohol use: Yes    Comment: drinks socially  . Drug use: No  . Sexual activity: Not on file  Lifestyle  . Physical activity    Days per week: Not on file    Minutes per session: Not on file  . Stress: Not on file  Relationships  . Social Musicianconnections    Talks on phone: Not on file    Gets together: Not on file    Attends religious service: Not on file    Active member of club or organization: Not on file    Attends meetings of clubs or organizations: Not on file    Relationship status: Not on file  Other Topics Concern  . Not on file  Social History Narrative  . Not on file     Family History: fhx - mom htn dm MI, chf Dad htn  No early mi,  Mgm - [age] died mi  No other SCD  ROS:   Please see the history of present illness.    *** All other systems reviewed and are negative.  EKGs/Labs/Other Studies Reviewed:    The following studies were reviewed today: ***  EKG:  EKG is *** ordered today.  The ekg ordered today demonstrates ***  Recent Labs: 07/17/2018: BUN 5; Creatinine, Ser 0.71; Hemoglobin 12.2; Platelets 292; Potassium 3.8; Sodium 137  Recent Lipid Panel No results found for: CHOL, TRIG, HDL, CHOLHDL, VLDL, LDLCALC, LDLDIRECT  Physical Exam:    VS:  There were no vitals taken for this visit.    Wt Readings from Last 3 Encounters:  08/02/18 117 lb 6.4 oz (53.3 kg)  04/27/16 125 lb (56.7 kg)  02/22/16 123 lb (55.8 kg)     GEN: *** Well nourished, well developed in no acute distress HEENT: Normal NECK: No JVD; No carotid bruits LYMPHATICS: No lymphadenopathy CARDIAC: ***RRR, no murmurs, rubs, gallops RESPIRATORY:  Clear to auscultation without rales, wheezing or rhonchi  ABDOMEN: Soft, non-tender, non-distended MUSCULOSKELETAL:  No edema; No deformity  SKIN: Warm and dry NEUROLOGIC:  Alert and oriented x 3  PSYCHIATRIC:  Normal affect   ASSESSMENT:    No diagnosis found. PLAN:    In order of problems listed above:  Chest pain: -Plan for coronary CT and echocardiogram  Medication Adjustments/Labs and Tests Ordered: Current medicines are reviewed at length with the patient today.  Concerns regarding medicines are outlined above.  No orders of the defined types were placed in this encounter.  No orders of the defined types were placed in this encounter.   There are no Patient Instructions on file for this visit.   Signed, Donato Heinz, MD  09/15/2018 2:21 PM    [facility]

## 2018-09-16 ENCOUNTER — Ambulatory Visit: Payer: BC Managed Care – PPO | Admitting: Cardiology

## 2018-10-07 ENCOUNTER — Telehealth (HOSPITAL_COMMUNITY): Payer: Self-pay

## 2018-10-07 NOTE — Telephone Encounter (Signed)
New message    Just an FYI. We have made several attempts to contact this patient including sending a letter to schedule or reschedule their echocardiogram. We will be removing the patient from the echo [place].   8.24.20 @ 11:02am lm on home vm Cosme Jacob  7.24.20 @ 11:18am spoke with pt - will call back - Callaway Hailes 7.20.20 no show

## 2018-10-15 NOTE — Telephone Encounter (Signed)
FYI NOTED

## 2019-02-24 DIAGNOSIS — H6692 Otitis media, unspecified, left ear: Secondary | ICD-10-CM | POA: Diagnosis not present

## 2019-06-16 DIAGNOSIS — B354 Tinea corporis: Secondary | ICD-10-CM | POA: Diagnosis not present

## 2019-12-26 DIAGNOSIS — R509 Fever, unspecified: Secondary | ICD-10-CM | POA: Diagnosis not present

## 2019-12-26 DIAGNOSIS — J040 Acute laryngitis: Secondary | ICD-10-CM | POA: Diagnosis not present

## 2020-01-12 DIAGNOSIS — Z124 Encounter for screening for malignant neoplasm of cervix: Secondary | ICD-10-CM | POA: Diagnosis not present

## 2020-01-12 DIAGNOSIS — R21 Rash and other nonspecific skin eruption: Secondary | ICD-10-CM | POA: Diagnosis not present

## 2020-01-12 DIAGNOSIS — Z0000 Encounter for general adult medical examination without abnormal findings: Secondary | ICD-10-CM | POA: Diagnosis not present

## 2020-01-12 DIAGNOSIS — Z113 Encounter for screening for infections with a predominantly sexual mode of transmission: Secondary | ICD-10-CM | POA: Diagnosis not present

## 2020-02-02 DIAGNOSIS — R110 Nausea: Secondary | ICD-10-CM | POA: Diagnosis not present

## 2020-02-02 DIAGNOSIS — R52 Pain, unspecified: Secondary | ICD-10-CM | POA: Diagnosis not present

## 2020-02-02 DIAGNOSIS — Z20822 Contact with and (suspected) exposure to covid-19: Secondary | ICD-10-CM | POA: Diagnosis not present

## 2020-02-02 DIAGNOSIS — R059 Cough, unspecified: Secondary | ICD-10-CM | POA: Diagnosis not present

## 2020-03-28 IMAGING — DX CHEST - 2 VIEW
2 series · 2 of 2 positions shown · non-contrast
Comparison: None.

CLINICAL DATA: Chest pain

EXAM:
CHEST - 2 VIEW

[chest pa]
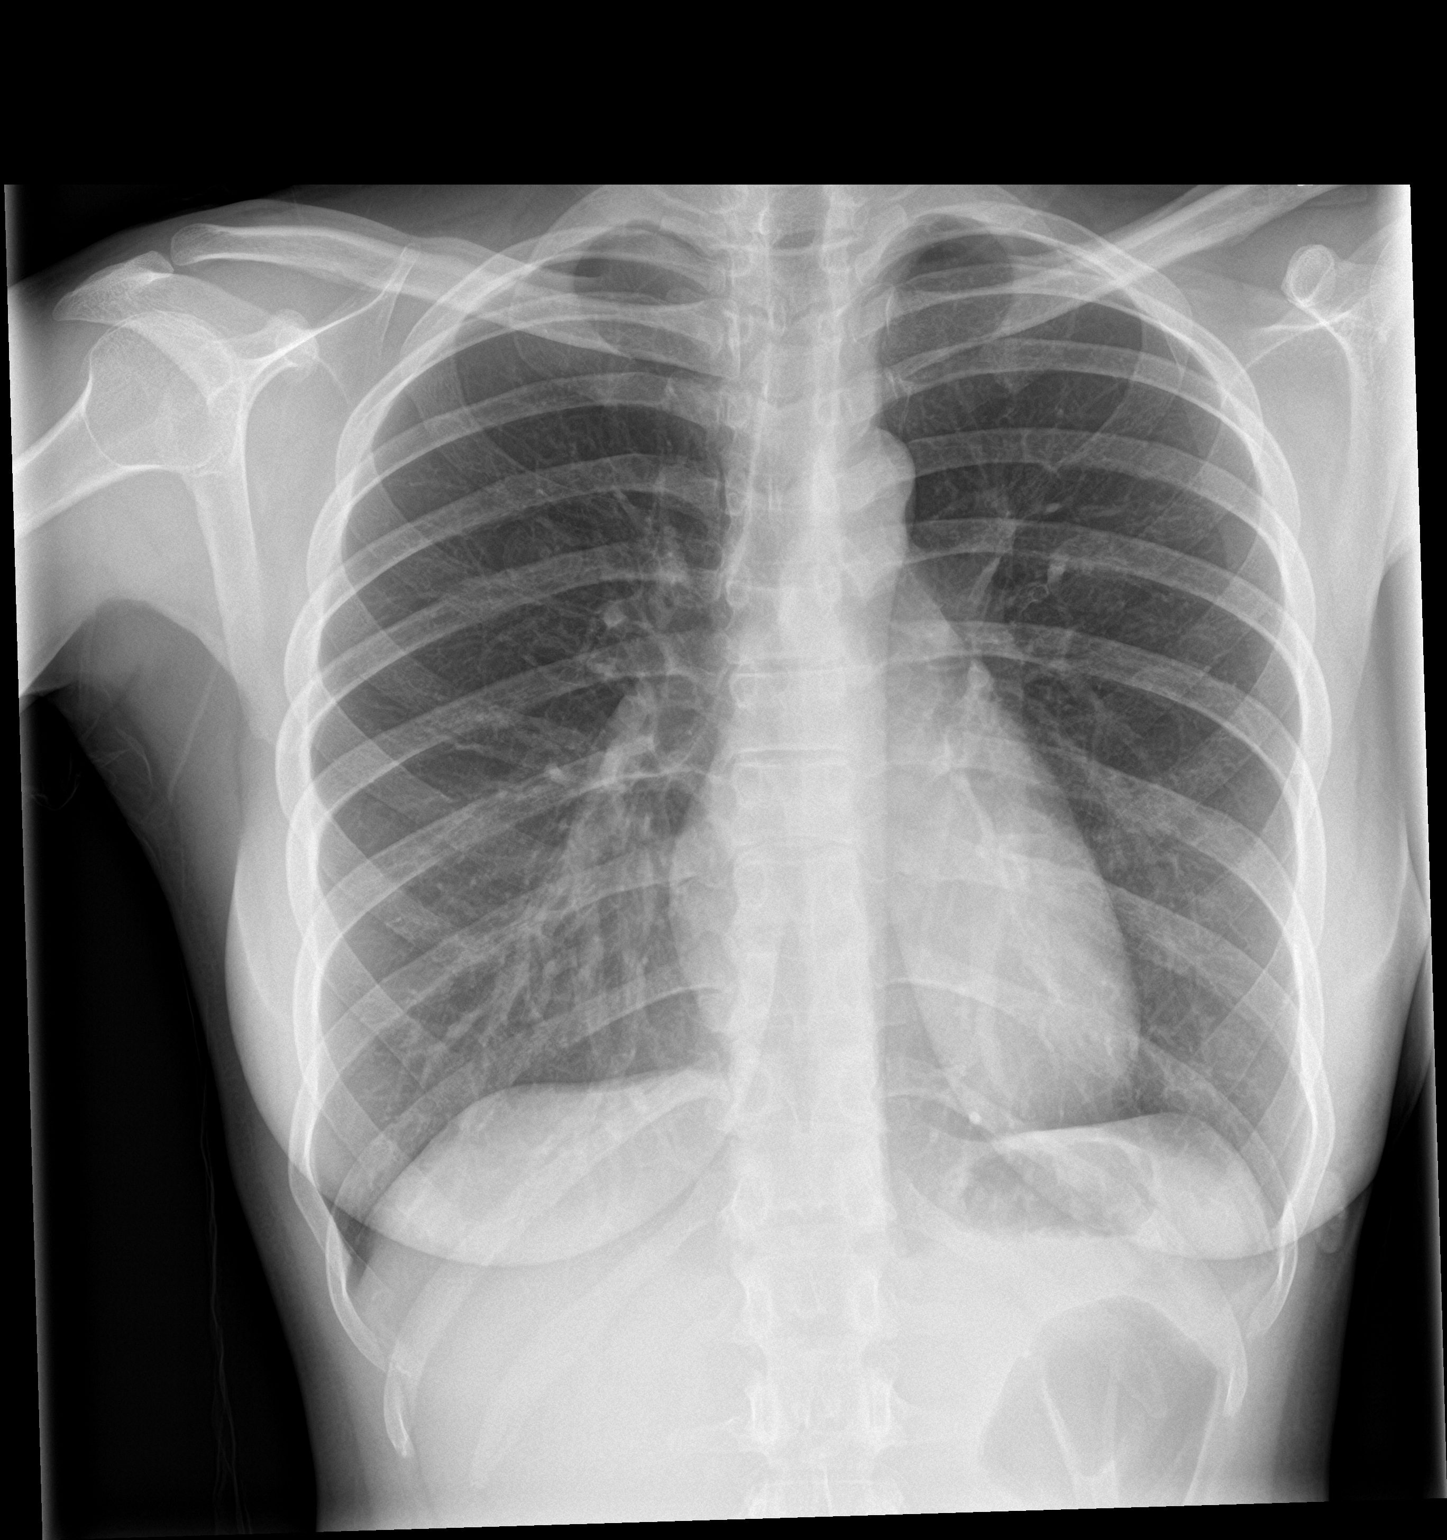

[chest lat]
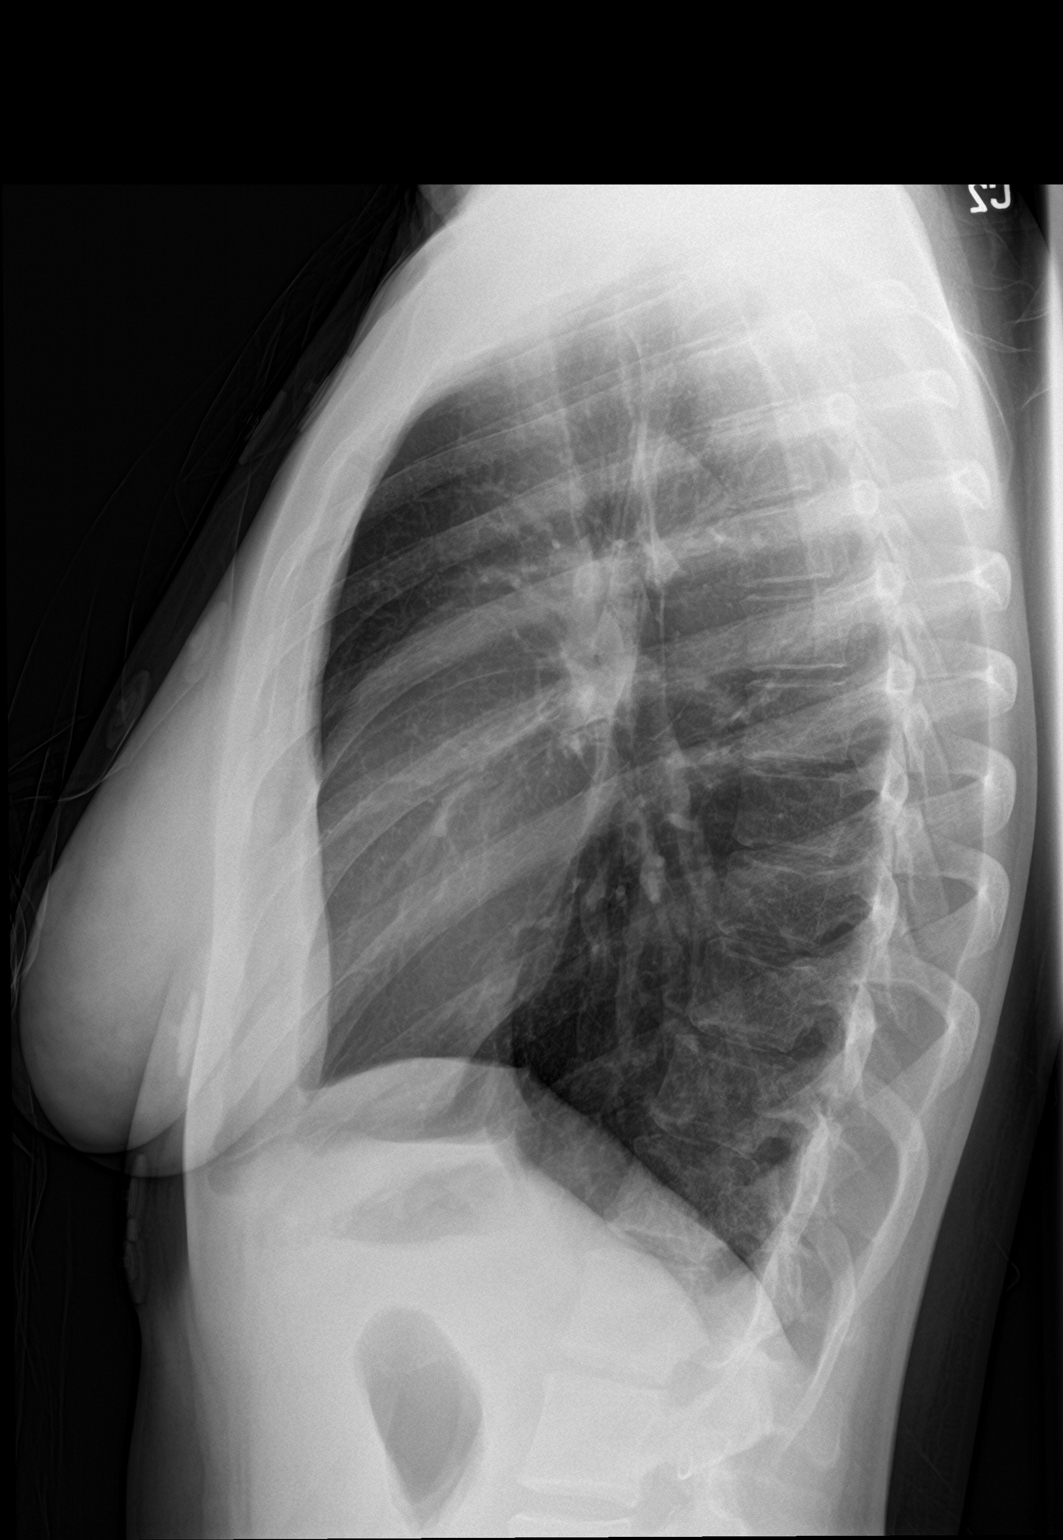

[2 of 2 positions shown; findings below may reference images not displayed]

FINDINGS: Lungs are clear. Heart size and pulmonary vascularity are normal. No
adenopathy. No pneumothorax. No bone lesions.
IMPRESSION: No edema or consolidation.

## 2020-06-07 DIAGNOSIS — F3341 Major depressive disorder, recurrent, in partial remission: Secondary | ICD-10-CM | POA: Diagnosis not present

## 2020-06-07 DIAGNOSIS — F419 Anxiety disorder, unspecified: Secondary | ICD-10-CM | POA: Diagnosis not present

## 2020-06-07 DIAGNOSIS — R636 Underweight: Secondary | ICD-10-CM | POA: Diagnosis not present

## 2020-07-05 DIAGNOSIS — F3341 Major depressive disorder, recurrent, in partial remission: Secondary | ICD-10-CM | POA: Diagnosis not present

## 2020-07-05 DIAGNOSIS — R32 Unspecified urinary incontinence: Secondary | ICD-10-CM | POA: Diagnosis not present

## 2020-07-05 DIAGNOSIS — D649 Anemia, unspecified: Secondary | ICD-10-CM | POA: Diagnosis not present

## 2020-07-05 DIAGNOSIS — F419 Anxiety disorder, unspecified: Secondary | ICD-10-CM | POA: Diagnosis not present

## 2020-07-05 DIAGNOSIS — R252 Cramp and spasm: Secondary | ICD-10-CM | POA: Diagnosis not present

## 2020-08-09 DIAGNOSIS — R32 Unspecified urinary incontinence: Secondary | ICD-10-CM | POA: Diagnosis not present

## 2020-08-09 DIAGNOSIS — F419 Anxiety disorder, unspecified: Secondary | ICD-10-CM | POA: Diagnosis not present

## 2020-08-09 DIAGNOSIS — F3341 Major depressive disorder, recurrent, in partial remission: Secondary | ICD-10-CM | POA: Diagnosis not present

## 2020-08-09 DIAGNOSIS — R252 Cramp and spasm: Secondary | ICD-10-CM | POA: Diagnosis not present

## 2020-09-01 DIAGNOSIS — H6690 Otitis media, unspecified, unspecified ear: Secondary | ICD-10-CM | POA: Diagnosis not present

## 2020-09-07 DIAGNOSIS — H60502 Unspecified acute noninfective otitis externa, left ear: Secondary | ICD-10-CM | POA: Diagnosis not present

## 2020-10-05 DIAGNOSIS — R2681 Unsteadiness on feet: Secondary | ICD-10-CM | POA: Diagnosis not present

## 2021-01-31 DIAGNOSIS — Z113 Encounter for screening for infections with a predominantly sexual mode of transmission: Secondary | ICD-10-CM | POA: Diagnosis not present

## 2021-01-31 DIAGNOSIS — Z114 Encounter for screening for human immunodeficiency virus [HIV]: Secondary | ICD-10-CM | POA: Diagnosis not present

## 2021-01-31 DIAGNOSIS — Z3202 Encounter for pregnancy test, result negative: Secondary | ICD-10-CM | POA: Diagnosis not present

## 2021-01-31 DIAGNOSIS — N926 Irregular menstruation, unspecified: Secondary | ICD-10-CM | POA: Diagnosis not present

## 2021-01-31 DIAGNOSIS — Z1159 Encounter for screening for other viral diseases: Secondary | ICD-10-CM | POA: Diagnosis not present

## 2021-01-31 DIAGNOSIS — Z118 Encounter for screening for other infectious and parasitic diseases: Secondary | ICD-10-CM | POA: Diagnosis not present

## 2021-08-15 DIAGNOSIS — F32A Depression, unspecified: Secondary | ICD-10-CM | POA: Diagnosis not present

## 2021-08-15 DIAGNOSIS — F419 Anxiety disorder, unspecified: Secondary | ICD-10-CM | POA: Diagnosis not present

## 2021-11-18 DIAGNOSIS — H60502 Unspecified acute noninfective otitis externa, left ear: Secondary | ICD-10-CM | POA: Diagnosis not present

## 2022-01-13 DIAGNOSIS — G35 Multiple sclerosis: Secondary | ICD-10-CM | POA: Diagnosis not present

## 2022-01-13 DIAGNOSIS — M47814 Spondylosis without myelopathy or radiculopathy, thoracic region: Secondary | ICD-10-CM | POA: Diagnosis not present

## 2022-01-13 DIAGNOSIS — G3789 Other specified demyelinating diseases of central nervous system: Secondary | ICD-10-CM | POA: Diagnosis not present

## 2022-01-31 DIAGNOSIS — G379 Demyelinating disease of central nervous system, unspecified: Secondary | ICD-10-CM | POA: Diagnosis not present

## 2022-02-02 DIAGNOSIS — G379 Demyelinating disease of central nervous system, unspecified: Secondary | ICD-10-CM | POA: Diagnosis not present

## 2022-02-02 DIAGNOSIS — Z87891 Personal history of nicotine dependence: Secondary | ICD-10-CM | POA: Diagnosis not present

## 2022-02-02 DIAGNOSIS — R531 Weakness: Secondary | ICD-10-CM | POA: Diagnosis not present

## 2022-02-02 DIAGNOSIS — Z79899 Other long term (current) drug therapy: Secondary | ICD-10-CM | POA: Diagnosis not present

## 2022-02-02 DIAGNOSIS — R5383 Other fatigue: Secondary | ICD-10-CM | POA: Diagnosis not present

## 2022-02-02 DIAGNOSIS — R2689 Other abnormalities of gait and mobility: Secondary | ICD-10-CM | POA: Diagnosis not present

## 2022-02-02 DIAGNOSIS — R0600 Dyspnea, unspecified: Secondary | ICD-10-CM | POA: Diagnosis not present

## 2022-02-02 DIAGNOSIS — G35 Multiple sclerosis: Secondary | ICD-10-CM | POA: Diagnosis not present

## 2022-03-27 DIAGNOSIS — G35 Multiple sclerosis: Secondary | ICD-10-CM | POA: Diagnosis not present

## 2022-05-03 DIAGNOSIS — L309 Dermatitis, unspecified: Secondary | ICD-10-CM | POA: Diagnosis not present

## 2022-05-03 DIAGNOSIS — L810 Postinflammatory hyperpigmentation: Secondary | ICD-10-CM | POA: Diagnosis not present

## 2022-07-03 DIAGNOSIS — G35 Multiple sclerosis: Secondary | ICD-10-CM | POA: Diagnosis not present

## 2022-10-10 DIAGNOSIS — G35 Multiple sclerosis: Secondary | ICD-10-CM | POA: Diagnosis not present

## 2023-05-21 DIAGNOSIS — G35 Multiple sclerosis: Secondary | ICD-10-CM | POA: Diagnosis not present

## 2023-05-21 DIAGNOSIS — Z599 Problem related to housing and economic circumstances, unspecified: Secondary | ICD-10-CM | POA: Diagnosis not present

## 2023-05-21 DIAGNOSIS — G379 Demyelinating disease of central nervous system, unspecified: Secondary | ICD-10-CM | POA: Diagnosis not present

## 2023-05-21 DIAGNOSIS — Z5941 Food insecurity: Secondary | ICD-10-CM | POA: Diagnosis not present
